# Patient Record
Sex: Female | Born: 1985 | Race: Black or African American | Hispanic: No | Marital: Married | Smoking: Former smoker
Health system: Southern US, Community
[De-identification: ages and names within clinical notes are randomized; demographics above are authoritative.]

## PROBLEM LIST (undated history)

## (undated) DIAGNOSIS — J020 Streptococcal pharyngitis: Secondary | ICD-10-CM

## (undated) DIAGNOSIS — L732 Hidradenitis suppurativa: Secondary | ICD-10-CM

## (undated) DIAGNOSIS — D649 Anemia, unspecified: Secondary | ICD-10-CM

## (undated) HISTORY — DX: Hidradenitis suppurativa: L73.2

---

## 2015-07-07 ENCOUNTER — Ambulatory Visit
Admission: EM | Admit: 2015-07-07 | Discharge: 2015-07-07 | Disposition: A | Discharge status: Other Acute Inpatient Hospital | Payer: Medicaid Other | Attending: Family Medicine | Admitting: Family Medicine

## 2015-07-07 ENCOUNTER — Emergency Department
Admission: EM | Admit: 2015-07-07 | Discharge: 2015-07-07 | Disposition: A | Discharge status: Home / Self Care | Payer: Medicaid Other | Attending: Emergency Medicine | Admitting: Emergency Medicine

## 2015-07-07 ENCOUNTER — Encounter: Payer: Self-pay | Admitting: Gynecology

## 2015-07-07 DIAGNOSIS — F17210 Nicotine dependence, cigarettes, uncomplicated: Secondary | ICD-10-CM | POA: Insufficient documentation

## 2015-07-07 DIAGNOSIS — L02412 Cutaneous abscess of left axilla: Secondary | ICD-10-CM | POA: Diagnosis present

## 2015-07-07 DIAGNOSIS — Z91040 Latex allergy status: Secondary | ICD-10-CM | POA: Diagnosis not present

## 2015-07-07 DIAGNOSIS — L02419 Cutaneous abscess of limb, unspecified: Secondary | ICD-10-CM

## 2015-07-07 DIAGNOSIS — Z3202 Encounter for pregnancy test, result negative: Secondary | ICD-10-CM | POA: Diagnosis not present

## 2015-07-07 DIAGNOSIS — L0291 Cutaneous abscess, unspecified: Secondary | ICD-10-CM

## 2015-07-07 LAB — GLUCOSE, CAPILLARY: Glucose-Capillary: 95 mg/dL (ref 65–99)

## 2015-07-07 MED ORDER — LIDOCAINE-EPINEPHRINE 1 %-1:200000 IJ SOLN
INTRAMUSCULAR | Status: AC
Start: 2015-07-07 — End: 2015-07-07
  Filled 2015-07-07: qty 30

## 2015-07-07 MED ORDER — SULFAMETHOXAZOLE-TRIMETHOPRIM 800-160 MG PO TABS
2.0000 | ORAL_TABLET | Freq: Once | ORAL | Status: AC
  Administered 2015-07-07: 2 via ORAL
  Filled 2015-07-07: qty 2

## 2015-07-07 MED ORDER — OXYCODONE-ACETAMINOPHEN 5-325 MG PO TABS
ORAL_TABLET | ORAL | Status: AC
  Filled 2015-07-07: qty 1

## 2015-07-07 MED ORDER — OXYCODONE-ACETAMINOPHEN 5-325 MG PO TABS
1.0000 | ORAL_TABLET | Freq: Once | ORAL | Status: AC
  Administered 2015-07-07: 1 via ORAL

## 2015-07-07 MED ORDER — SULFAMETHOXAZOLE-TRIMETHOPRIM 800-160 MG PO TABS: 2.0000 | ORAL_TABLET | Freq: Two times a day (BID) | ORAL | Status: DC

## 2015-07-07 NOTE — Discharge Instructions (Signed)
Needs to go to the ER for further pain control and I&D if needed.

## 2015-07-07 NOTE — ED Provider Notes (Signed)
[facility] Emergency Department Provider Note  ____________________________________________  Time seen: Approximately 4:32 PM  I have reviewed the triage vital signs and the nursing notes.   HISTORY  Chief Complaint Abscess    HPI Nichole Berry is a 29 y.o. female with a history of axillary abscesses who is presenting with a left axillary abscess. She says it has worsened over the past 2-3 days. She does not report a fever. She says that she has been having increased pain. Has had abscesses every 2-3 months. Does not see a primary care doctor. Does not know of any chronic medical conditions.   No past medical history on file.  There are no active problems to display for this patient.   Past Surgical History  Procedure Laterality Date  . Cesarean section      x2    No current outpatient prescriptions on file.  Allergies Latex  No family history on file.  Social History Social History  Substance Use Topics  . Smoking status: Current Every Day Smoker -- 0.50 packs/day    Types: Cigarettes  . Smokeless tobacco: Not on file  . Alcohol Use: No    Review of Systems Constitutional: No fever/chills Eyes: No visual changes. ENT: No sore throat. Cardiovascular: Denies chest pain. Respiratory: Denies shortness of breath. Gastrointestinal: No abdominal pain.  No nausea, no vomiting.  No diarrhea.  No constipation. Genitourinary: Negative for dysuria. Musculoskeletal: Negative for back pain. Skin: Negative for rash. Neurological: Negative for headaches, focal weakness or numbness.  10-point ROS otherwise negative.  ____________________________________________   PHYSICAL EXAM:  VITAL SIGNS: ED Triage Vitals  Enc Vitals Group     BP 07/07/15 1530 117/76 mmHg     Pulse Rate 07/07/15 1530 77     Resp 07/07/15 1530 18     Temp 07/07/15 1530 98.5 F (36.9 C)     Temp Source 07/07/15 1530 Oral     SpO2 07/07/15 1530 100 %     Weight  07/07/15 1530 310 lb (140.615 kg)     Height 07/07/15 1530 5\' 6"  (1.676 m)     Head Cir --      Peak Flow --      Pain Score 07/07/15 1531 10     Pain Loc --      Pain Edu? --      Excl. in GC? --     Constitutional: Alert and oriented. Well appearing and in no acute distress. Eyes: Conjunctivae are normal. PERRL. EOMI. Head: Atraumatic. Nose: No congestion/rhinnorhea. Mouth/Throat: Mucous membranes are moist.   Neck: No stridor.   Cardiovascular: Normal rate, regular rhythm. Grossly normal heart sounds.  Good peripheral circulation. Respiratory: Normal respiratory effort.  No retractions. Lungs CTAB. Gastrointestinal: Soft and nontender. No distention. No abdominal bruits. No CVA tenderness. Musculoskeletal: No lower extremity tenderness nor edema.  No joint effusions. Neurologic:  Normal speech and language. No gross focal neurologic deficits are appreciated. No gait instability. Skin:  Skin is warm, dry and intact. No rash noted. Left axilla with a 3 cm x 3 cm fluctuant area at the inferior border of the axilla. There is a indurated area anterior to this with hyperpigmentation. There is very tender without any pus drainage at this time. Psychiatric: Mood and affect are normal. Speech and behavior are normal.  ____________________________________________   LABS (all labs ordered are listed, but only abnormal results are displayed)  Labs Reviewed  GLUCOSE, CAPILLARY  CBG MONITORING, ED  POC URINE PREG, ED  ____________________________________________  EKG   ____________________________________________  RADIOLOGY   ____________________________________________   PROCEDURES  INCISION AND DRAINAGE Performed by: Arelia Longest Consent: Verbal consent obtained. Risks and benefits: risks, benefits and alternatives were discussed Type: abscess  Body area: Left axilla  Anesthesia: local infiltration  Incision was made with a scalpel.  Local anesthetic:  lidocaine 1 % with epinephrine  Anesthetic total: 3 ml  Complexity: complex Blunt dissection to break up loculations  Drainage: purulent  Drainage amount: 5 ML's   Packing material: 1/4 in iodoform gauze  Patient tolerance: Patient tolerated the procedure well with no immediate complications.     ____________________________________________   INITIAL IMPRESSION / ASSESSMENT AND PLAN / ED COURSE  Pertinent labs & imaging results that were available during my care of the patient were reviewed by me and considered in my medical decision making (see chart for details).  ----------------------------------------- 5:40 PM on 07/07/2015 -----------------------------------------  Patient is not a primary care doctor but will go to urgent care in 2 days for follow-up for her wound check as well as to remove the packing. She told me that she has had multiple abscesses in the past and also people in her family have abscesses. I suspect she has hidradenitis. We'll also give phone number for surgery follow-up. ____________________________________________   FINAL CLINICAL IMPRESSION(S) / ED DIAGNOSES  Left axillary abscess.    Myrna Blazer, MD 07/07/15 [phone]

## 2015-07-07 NOTE — ED Provider Notes (Addendum)
CSN: [id number]     Arrival date & time 07/07/15  1346 History   First MD Initiated Contact with Patient 07/07/15 1431     Chief Complaint  Patient presents with  . Recurrent Skin Infections   (Consider location/radiation/quality/duration/timing/severity/associated sxs/prior Treatment) HPI Patient presents today with history of left axillary abscess. Patient states that she often gets these in different places on her body. She states that she is in severe pain due to this abscess. She is tearful in the office due to this. She is currently not taking any antibiotics. She denies being diabetic. She has had subjective fever and some chills.  History reviewed. No pertinent past medical history. Past Surgical History  Procedure Laterality Date  . Cesarean section      x2   No family history on file. Social History  Substance Use Topics  . Smoking status: Current Every Day Smoker -- 0.50 packs/day    Types: Cigarettes  . Smokeless tobacco: None  . Alcohol Use: No   OB History    No data available     Review of Systems Negative except mentioned above.  Allergies  Latex  Home Medications   Prior to Admission medications   Not on File   Meds Ordered and Administered this Visit  Medications - No data to display  BP 102/65 mmHg  Pulse 75  Temp(Src) 98.3 F (36.8 C) (Oral)  Resp 20  Ht 5\' 6"  (1.676 m)  Wt 310 lb (140.615 kg)  BMI 50.06 kg/m2  SpO2 100%  LMP 06/24/2015 No data found.   Physical Exam  Patient is uncomfortable due to pain in left axillary area When raising patient's arm she becomes very tearful and is unable to let me examine it fully due to her pain.  ED Course  Procedures (including critical care time)  Labs Review Labs Reviewed - No data to display  Imaging Review No results found.     MDM   1. Abscess, axilla    Patient will need adequate pain control before any potential I&D of the area can be done. She is not cooperative with doing  the exam.  I have recommended that she go to the ER for further pain control before possible surgical procedure. Patient addresses understanding and there is someone with her that will take her to the ER now.    Jolene ProvostKirtida Shaira Sova, MD 07/07/15 1641  Jolene ProvostKirtida Wylan Gentzler, MD 07/07/15 [phone]

## 2015-07-07 NOTE — ED Notes (Signed)
Patient c/o recurrent boil at left axillary. Pt. Stated prone to boils under her axillary areas.

## 2015-07-07 NOTE — ED Notes (Signed)
Pt sent from [facility] urgent care with an abscess to her left underarm, was told that they don't have the medication they need to treat her there, pt states that it has been bothering her for the past 2-3 days, pt tearful

## 2015-07-10 LAB — POCT PREGNANCY, URINE: Preg Test, Ur: NEGATIVE

## 2016-01-28 ENCOUNTER — Emergency Department
Admission: EM | Admit: 2016-01-28 | Discharge: 2016-01-29 | Disposition: A | Discharge status: Home / Self Care | Payer: Medicaid Other | Attending: Emergency Medicine | Admitting: Emergency Medicine

## 2016-01-28 ENCOUNTER — Encounter: Payer: Self-pay | Admitting: Emergency Medicine

## 2016-01-28 DIAGNOSIS — F17210 Nicotine dependence, cigarettes, uncomplicated: Secondary | ICD-10-CM | POA: Diagnosis not present

## 2016-01-28 DIAGNOSIS — L732 Hidradenitis suppurativa: Secondary | ICD-10-CM | POA: Diagnosis not present

## 2016-01-28 DIAGNOSIS — Z792 Long term (current) use of antibiotics: Secondary | ICD-10-CM | POA: Insufficient documentation

## 2016-01-28 DIAGNOSIS — L02412 Cutaneous abscess of left axilla: Secondary | ICD-10-CM | POA: Diagnosis present

## 2016-01-28 MED ORDER — HYDROMORPHONE HCL 1 MG/ML IJ SOLN
1.0000 mg | Freq: Once | INTRAMUSCULAR | Status: AC
  Administered 2016-01-28: 1 mg via INTRAVENOUS
  Filled 2016-01-28: qty 1

## 2016-01-28 MED ORDER — CLINDAMYCIN PHOSPHATE 600 MG/50ML IV SOLN
600.0000 mg | Freq: Once | INTRAVENOUS | Status: AC
  Administered 2016-01-28: 600 mg via INTRAVENOUS
  Filled 2016-01-28: qty 50

## 2016-01-28 MED ORDER — ONDANSETRON HCL 4 MG/2ML IJ SOLN
4.0000 mg | Freq: Once | INTRAMUSCULAR | Status: AC
  Administered 2016-01-28: 4 mg via INTRAVENOUS
  Filled 2016-01-28: qty 2

## 2016-01-28 MED ORDER — OXYCODONE-ACETAMINOPHEN 7.5-325 MG PO TABS: 1.0000 | ORAL_TABLET | ORAL | Status: AC | PRN

## 2016-01-28 MED ORDER — CLINDAMYCIN HCL 150 MG PO CAPS: 150.0000 mg | ORAL_CAPSULE | Freq: Four times a day (QID) | ORAL | Status: DC

## 2016-01-28 NOTE — ED Notes (Signed)
Patient ambulatory to triage with steady gait, without difficulty or distress noted; pt reports abscess to left axillae x week with hx of same

## 2016-01-28 NOTE — ED Provider Notes (Signed)
[facility] Emergency Department Provider Note   ____________________________________________  Time seen: Approximately 10:19 PM  I have reviewed the triage vital signs and the nursing notes.   HISTORY  Chief Complaint Abscess    HPI Nichole Berry is a 30 y.o. female who presents to the emergency department with complaints of a left axilla abscess. She first noticed the pain and swelling one week ago. The pain and swelling has increased gradually. She also complains of left arm pain and tingling. Patient rates current pain as 10/10. She has tried warm compresses, tylenol, ibuprofen, and her mother's Tramadol with no relief. Denies fever, chills, nausea, vomiting, or recent antibiotic use.   History reviewed. No pertinent past medical history.  There are no active problems to display for this patient.   Past Surgical History  Procedure Laterality Date  . Cesarean section      x2    Current Outpatient Rx  Name  Route  Sig  Dispense  Refill  . clindamycin (CLEOCIN) 150 MG capsule   Oral   Take 1 capsule (150 mg total) by mouth 4 (four) times daily.   40 capsule   0   . oxyCODONE-acetaminophen (PERCOCET) 7.5-325 MG tablet   Oral   Take 1 tablet by mouth every 4 (four) hours as needed for severe pain.   20 tablet   0   . sulfamethoxazole-trimethoprim (BACTRIM DS,SEPTRA DS) 800-160 MG tablet   Oral   Take 2 tablets by mouth 2 (two) times daily.   40 tablet   0     Allergies Latex  No family history on file.  Social History Social History  Substance Use Topics  . Smoking status: Current Every Day Smoker -- 0.50 packs/day    Types: Cigarettes  . Smokeless tobacco: None  . Alcohol Use: No    Review of Systems Constitutional: No fever/chills Eyes: No visual changes. ENT: No sore throat. Cardiovascular: Denies chest pain. Respiratory: Denies shortness of breath. Gastrointestinal: No abdominal pain.  No nausea, no vomiting.  No  diarrhea.  No constipation. Genitourinary: Negative for dysuria. Musculoskeletal: Negative for back pain. Skin: Edema left axillary area  Neurological: Negative for headaches, focal weakness or numbness. Hematological/Lymphatic: Allergic/Immunilogical: Latex  ____________________________________________   PHYSICAL EXAM:  VITAL SIGNS: ED Triage Vitals  Enc Vitals Group     BP 01/28/16 2144 136/84 mmHg     Pulse Rate 01/28/16 2144 97     Resp 01/28/16 2144 20     Temp 01/28/16 2144 98.4 F (36.9 C)     Temp Source 01/28/16 2144 Oral     SpO2 01/28/16 2144 100 %     Weight 01/28/16 2144 310 lb (140.615 kg)     Height 01/28/16 2144  (1.676 m)     Head Cir --      Peak Flow --      Pain Score 01/28/16 2144 10     Pain Loc --      Pain Edu? --      Excl. in GC? --     Constitutional: Alert and oriented. Well appearing and in no acute distress. Morbid obesity Eyes: Conjunctivae are normal. PERRL. EOMI. Head: Atraumatic. Nose: No congestion/rhinnorhea. Mouth/Throat: Mucous membranes are moist.  Oropharynx non-erythematous. Neck: No stridor.  No cervical spine tenderness to palpation. Hematological/Lymphatic/Immunilogical: No cervical lymphadenopathy. Cardiovascular: Normal rate, regular rhythm. Grossly normal heart sounds.  Good peripheral circulation. Respiratory: Normal respiratory effort.  No retractions. Lungs CTAB. Gastrointestinal: Soft and nontender.  No distention. No abdominal bruits. No CVA tenderness. Musculoskeletal: No lower extremity tenderness nor edema.  No joint effusions. Neurologic:  Normal speech and language. No gross focal neurologic deficits are appreciated. No gait instability. Skin:  Skin is warm, dry and intact. Edematous nonfluctuant nodule lesion left axillary area Psychiatric: Mood and affect are normal. Speech and behavior are normal.  ____________________________________________   LABS (all labs ordered are listed, but only abnormal results  are displayed)  Labs Reviewed - No data to display ____________________________________________  EKG   ____________________________________________  RADIOLOGY   ____________________________________________   PROCEDURES  Procedure(s) performed: None  Critical Care performed: No  ____________________________________________   INITIAL IMPRESSION / ASSESSMENT AND PLAN / ED COURSE  Pertinent labs & imaging results that were available during my care of the patient were reviewed by me and considered in my medical decision making (see chart for details).  Hidradenitis left axillary area. Discussed rationale for not performing I&D at this time. Patient given discharge Instructions. Patient given prescription for clindamycin and Percocets. Patient in a work note for 3 days. Patient advised follow-up with the surgical clinic if there is no improvement in 3-5 days. ____________________________________________   FINAL CLINICAL IMPRESSION(S) / ED DIAGNOSES  Final diagnoses:  Hidradenitis axillaris      NEW MEDICATIONS STARTED DURING THIS VISIT:  New Prescriptions   CLINDAMYCIN (CLEOCIN) 150 MG CAPSULE    Take 1 capsule (150 mg total) by mouth 4 (four) times daily.   OXYCODONE-ACETAMINOPHEN (PERCOCET) 7.5-325 MG TABLET    Take 1 tablet by mouth every 4 (four) hours as needed for severe pain.     Note:  This document was prepared using Dragon voice recognition software and may include unintentional dictation errors.    Joni ReiningRonald K Smith, PA-C 01/28/16 2241  Sharman CheekPhillip Stafford, MD 01/28/16 2308

## 2016-01-28 NOTE — Discharge Instructions (Signed)

## 2016-01-31 ENCOUNTER — Telehealth: Payer: Self-pay | Admitting: Emergency Medicine

## 2016-01-31 NOTE — ED Notes (Signed)
Dr sankar's office called because pt was referred when he was not on unassigned call.  The patient has not called them yet.  I called the patient to inform her that she should follow with dr cooper instead.  Patient says she went to another hospital and has been taken care of.

## 2018-02-09 LAB — OB RESULTS CONSOLE HEPATITIS B SURFACE ANTIGEN: Hepatitis B Surface Ag: NEGATIVE

## 2018-02-09 LAB — OB RESULTS CONSOLE HIV ANTIBODY (ROUTINE TESTING): HIV: NONREACTIVE

## 2018-06-13 ENCOUNTER — Other Ambulatory Visit: Payer: Self-pay

## 2018-06-13 DIAGNOSIS — O365931 Maternal care for other known or suspected poor fetal growth, third trimester, fetus 1: Secondary | ICD-10-CM

## 2018-06-16 ENCOUNTER — Ambulatory Visit
Admission: RE | Admit: 2018-06-16 | Discharge: 2018-06-16 | Disposition: A | Discharge status: Home / Self Care | Payer: Medicaid Other | Source: Ambulatory Visit | Attending: Maternal & Fetal Medicine | Admitting: Maternal & Fetal Medicine

## 2018-06-16 DIAGNOSIS — O365931 Maternal care for other known or suspected poor fetal growth, third trimester, fetus 1: Secondary | ICD-10-CM

## 2018-06-16 DIAGNOSIS — O365930 Maternal care for other known or suspected poor fetal growth, third trimester, not applicable or unspecified: Secondary | ICD-10-CM | POA: Insufficient documentation

## 2018-06-16 DIAGNOSIS — Z3A36 36 weeks gestation of pregnancy: Secondary | ICD-10-CM | POA: Diagnosis not present

## 2018-06-16 NOTE — ED Notes (Signed)
Pt informed Dr.Small of the need for assistance with transportation to f/u appts twice a week with [facility].  Gayland Curry at the [facility] notified of need over the phone.  Pt was then assigned to Case Manager Rasheda at the [facility].  Arlana Hove is to evaluate the pt situation and contact the patient on her cell phone for her appt next week on both Monday and Thursday and continued until delivery at 39 weeks.

## 2018-06-19 LAB — OB RESULTS CONSOLE GBS: STREP GROUP B AG: NEGATIVE

## 2018-06-20 ENCOUNTER — Ambulatory Visit
Admission: RE | Admit: 2018-06-20 | Discharge: 2018-06-20 | Disposition: A | Discharge status: Home / Self Care | Payer: Medicaid Other | Source: Ambulatory Visit | Attending: Maternal & Fetal Medicine | Admitting: Maternal & Fetal Medicine

## 2018-06-20 ENCOUNTER — Other Ambulatory Visit: Payer: Self-pay

## 2018-06-20 DIAGNOSIS — O365931 Maternal care for other known or suspected poor fetal growth, third trimester, fetus 1: Secondary | ICD-10-CM

## 2018-06-20 NOTE — Progress Notes (Signed)
MFM NST review   Antenatal testing performed due to fetal growth restriction. 36/6 weeks.   NST 120s reactive, +mod variability No ctx  Findings reviewed.  She will continue twice weekly testing Plan delivery 39w (EFW 9th percentile).

## 2018-06-23 ENCOUNTER — Ambulatory Visit
Admission: RE | Admit: 2018-06-23 | Discharge: 2018-06-23 | Disposition: A | Discharge status: Home / Self Care | Payer: Medicaid Other | Source: Ambulatory Visit | Attending: Obstetrics and Gynecology | Admitting: Obstetrics and Gynecology

## 2018-06-23 ENCOUNTER — Encounter: Payer: Self-pay | Admitting: Obstetrics and Gynecology

## 2018-06-23 ENCOUNTER — Ambulatory Visit (INDEPENDENT_AMBULATORY_CARE_PROVIDER_SITE_OTHER): Payer: Medicaid Other | Admitting: Obstetrics and Gynecology

## 2018-06-23 VITALS — BP 112/66 | Wt 328.0 lb

## 2018-06-23 DIAGNOSIS — Z98891 History of uterine scar from previous surgery: Secondary | ICD-10-CM | POA: Insufficient documentation

## 2018-06-23 DIAGNOSIS — O99013 Anemia complicating pregnancy, third trimester: Secondary | ICD-10-CM

## 2018-06-23 DIAGNOSIS — O09299 Supervision of pregnancy with other poor reproductive or obstetric history, unspecified trimester: Secondary | ICD-10-CM | POA: Insufficient documentation

## 2018-06-23 DIAGNOSIS — O99213 Obesity complicating pregnancy, third trimester: Secondary | ICD-10-CM

## 2018-06-23 DIAGNOSIS — A5901 Trichomonal vulvovaginitis: Secondary | ICD-10-CM | POA: Insufficient documentation

## 2018-06-23 DIAGNOSIS — O99019 Anemia complicating pregnancy, unspecified trimester: Secondary | ICD-10-CM | POA: Insufficient documentation

## 2018-06-23 DIAGNOSIS — O99323 Drug use complicating pregnancy, third trimester: Secondary | ICD-10-CM | POA: Diagnosis not present

## 2018-06-23 DIAGNOSIS — Z3A37 37 weeks gestation of pregnancy: Secondary | ICD-10-CM | POA: Diagnosis not present

## 2018-06-23 DIAGNOSIS — O365930 Maternal care for other known or suspected poor fetal growth, third trimester, not applicable or unspecified: Secondary | ICD-10-CM

## 2018-06-23 DIAGNOSIS — O0993 Supervision of high risk pregnancy, unspecified, third trimester: Secondary | ICD-10-CM | POA: Insufficient documentation

## 2018-06-23 DIAGNOSIS — O34219 Maternal care for unspecified type scar from previous cesarean delivery: Secondary | ICD-10-CM | POA: Diagnosis not present

## 2018-06-23 DIAGNOSIS — O365931 Maternal care for other known or suspected poor fetal growth, third trimester, fetus 1: Secondary | ICD-10-CM | POA: Insufficient documentation

## 2018-06-23 DIAGNOSIS — O99210 Obesity complicating pregnancy, unspecified trimester: Secondary | ICD-10-CM | POA: Insufficient documentation

## 2018-06-23 DIAGNOSIS — O23593 Infection of other part of genital tract in pregnancy, third trimester: Secondary | ICD-10-CM

## 2018-06-23 DIAGNOSIS — O09293 Supervision of pregnancy with other poor reproductive or obstetric history, third trimester: Secondary | ICD-10-CM

## 2018-06-23 DIAGNOSIS — O23599 Infection of other part of genital tract in pregnancy, unspecified trimester: Secondary | ICD-10-CM

## 2018-06-23 DIAGNOSIS — O365990 Maternal care for other known or suspected poor fetal growth, unspecified trimester, not applicable or unspecified: Secondary | ICD-10-CM | POA: Insufficient documentation

## 2018-06-23 LAB — OB RESULTS CONSOLE RUBELLA ANTIBODY, IGM: Rubella: IMMUNE

## 2018-06-23 LAB — OB RESULTS CONSOLE VARICELLA ZOSTER ANTIBODY, IGG: Varicella: NON-IMMUNE/NOT IMMUNE

## 2018-06-23 LAB — OB RESULTS CONSOLE GC/CHLAMYDIA
Chlamydia: NEGATIVE
GC PROBE AMP, GENITAL: NEGATIVE

## 2018-06-23 NOTE — Progress Notes (Signed)
OB discuss delivery plan Previous C/S failure to progress

## 2018-06-23 NOTE — Progress Notes (Signed)
Obstetric H&P   Chief Complaint: Delivery planning  Prenatal Care Provider: [facility]  History of Present Illness: 32 y.o. [id number] [redacted]w[redacted]d by 07/12/2018, by Last Menstrual Period presenting for delivery planning in setting of two prior cesarean section and IUGR at 9%ile, with APT being done at [facility].  +FM, no LOF, no VB, no ctx.  Normal APT today at [facility]    She relays history of failing to dilate with G1 and repeat cesarean with G2.  She was put to sleep for her C-section with G2 pregnancy after 6 failed attempts at spinal anesthesia.     Pregravid weight Pregravid weight not on file Total Weight Gain Not found.  Pregnancy Problems (from 06/16/18 to present)    Problem Noted Resolved   Pregnancy, supervision, high-risk, third trimester 06/23/2018 by Vena Austria, MD No   Overview Addendum 06/23/2018  2:52 PM by Vena Austria, MD    Clinic [place] transfer to [facility] Prenatal Labs  Dating LMP = 26 week [place] Blood type: O positive  Genetic Screen NIPS: Negative Antibody: Negative  Anatomic [place] Normal Female Rubella: Immune Varicella: Non-Immune  GTT Early: 96 Third trimester: 73 RPR: NR  Rhogam N/A HBsAg: Negative  TDaP vaccine 05/27/18 Flu Shot: 05/27/18 HIV: Negative  Baby Food Breast                         GBS:   Contraception None Pap: No records on pap from [facility] or [place]  CBB   Hgb electrophoresis - alpha thalassemia   CS/VBAC Repeat C-section 07/05/18   Support Person Calvin (Husband)           IUGR (intrauterine growth restriction) affecting care of mother 06/23/2018 by Vena Austria, MD No   Overview Signed 06/23/2018  2:47 PM by Vena Austria, MD    Being followed by DP with twice weekly APT      History of cesarean section 06/23/2018 by Vena Austria, MD No   Maternal obesity, antepartum 06/23/2018 by Vena Austria, MD No   Overview Signed 06/23/2018  2:48 PM by Vena Austria, MD    BMI Body mass index is 52.94 kg/m. - anesthesia consult 06/27/2018  at 12:00      Substance abuse affecting pregnancy in third trimester, antepartum 06/23/2018 by Vena Austria, MD No   Overview Signed 06/23/2018  2:49 PM by Vena Austria, MD    THC + UDS at [facility], also history of EtOH abuse      Trichomonal vaginitis during pregnancy, antepartum 06/23/2018 by Vena Austria, MD No   Anemia, antepartum 06/23/2018 by Vena Austria, MD No   Overview Signed 06/23/2018  2:50 PM by Vena Austria, MD    Hgb 9.8 at 28 weeks, one supplemental iron      History of postpartum hemorrhage, currently pregnant 06/23/2018 by Vena Austria, MD No      Review of Systems: 10 point review of systems negative unless otherwise noted in HPI  Past Medical History: History reviewed. No pertinent past medical history.  Past Surgical History: Past Surgical History:  Procedure Laterality Date  . CESAREAN SECTION  2006, 2013   x2    Past Obstetric History: #: 1, Date: 10/18/04, Sex: Female, Weight: None, GA: None, Delivery: C-Section, Low Transverse, Apgar1: None, Apgar5: None, Living: Living, Birth Comments: None  #: 2, Date: 05/03/12, Sex: Female, Weight: None, GA: None, Delivery: C-Section, Low Transverse, Apgar1: None, Apgar5: None, Living: Living, Birth Comments: None  #: 3, Date: None, Sex:  None, Weight: None, GA: None, Delivery: None, Apgar1: None, Apgar5: None, Living: None, Birth Comments: None   Past Gynecologic History:  Family History: Family History  Problem Relation Age of Onset  . Hypertension Mother   . Diabetes Mother   . Kidney disease Father   . Cancer Maternal Grandmother   . Diabetes Maternal Grandmother   . Hypertension Maternal Grandmother   . Cancer Maternal Grandfather   . Diabetes Maternal Grandfather   . Hypertension Maternal Grandfather     Social History: Social History   Socioeconomic History  . Marital status: Married    Spouse name: Jerilynn Som  . Number of children: Not on file  . Years of education: Not on  file  . Highest education level: Not on file  Occupational History  . Occupation: Unemployed  Social Needs  . Financial resource strain: Not on file  . Food insecurity:    Worry: Not on file    Inability: Not on file  . Transportation needs:    Medical: Not on file    Non-medical: Not on file  Tobacco Use  . Smoking status: Former Smoker    Packs/day: 0.50    Types: Cigarettes  . Smokeless tobacco: Never Used  Substance and Sexual Activity  . Alcohol use: No  . Drug use: No  . Sexual activity: Yes  Lifestyle  . Physical activity:    Days per week: Not on file    Minutes per session: Not on file  . Stress: Not on file  Relationships  . Social connections:    Talks on phone: Not on file    Gets together: Not on file    Attends religious service: Not on file    Active member of club or organization: Not on file    Attends meetings of clubs or organizations: Not on file    Relationship status: Not on file  . Intimate partner violence:    Fear of current or ex partner: Not on file    Emotionally abused: Not on file    Physically abused: Not on file    Forced sexual activity: Not on file  Other Topics Concern  . Not on file  Social History Narrative  . Not on file    Medications: Prior to Admission medications   Medication Sig Start Date End Date Taking? Authorizing Provider  ferrous sulfate 325 (65 FE) MG tablet Take 325 mg by mouth 2 (two) times daily with a meal.   Yes [provider]  Prenatal Vit-Fe Fumarate-FA (MULTIVITAMIN-PRENATAL) 27-0.8 MG TABS tablet Take 1 tablet by mouth daily at 12 noon.   Yes [provider]    Allergies: Allergies  Allergen Reactions  . Latex Hives    Physical Exam: Vitals: Blood pressure 112/66, weight (!) 328 lb (148.8 kg), last menstrual period 10/05/2017.  General: NAD HEENT: normocephalic, anicteric Pulmonary: No increased work of breathing Cardiovascular: RRR, distal pulses 2+ Abdomen: Gravid,  non-tender, well healed pfannenstiel  Genitourinary: deferred Extremities: no edema, erythema, or tenderness Neurologic: Grossly intact Psychiatric: mood appropriate, affect full  Labs: No results found for this or any previous visit (from the past 24 hour(s)).  Assessment: 32 y.o. [id number] [redacted]w[redacted]d by 07/12/2018, by Last Menstrual Period  Presenting for delivery planning  Plan: 1) History of 2 prior cesarean section - delivery recommended at 39 week by [facility].  Given BMI over 50 anesthesia consult.    2) History of general anesthesia with G2 pregnancy secondary to failed attempts at spinal  3) PNL - Blood type  O positive / Anti-bodyscreen Negative / Rubella Immune / Varicella Non-immune / RPR   / HBsAg negative / HIV negative / 1-hr OGTT 73 4) Immunization History -   There is no immunization history on file for this patient.  5) Disposition - C-section scheduled for 07/05/18 pending evaluation by anesthesia.  Discussed if difficult airway she may need to delivery outside of [facility]  Vena Austria, MD, [facility], [facility] 06/23/2018, 2:11 PM

## 2018-06-27 ENCOUNTER — Other Ambulatory Visit: Payer: Self-pay

## 2018-06-27 ENCOUNTER — Encounter
Admission: RE | Admit: 2018-06-27 | Discharge: 2018-06-27 | Disposition: A | Discharge status: Home / Self Care | Payer: Medicaid Other | Source: Ambulatory Visit | Attending: Anesthesiology | Admitting: Anesthesiology

## 2018-06-27 ENCOUNTER — Ambulatory Visit
Admission: RE | Admit: 2018-06-27 | Discharge: 2018-06-27 | Disposition: A | Discharge status: Home / Self Care | Payer: Medicaid Other | Source: Ambulatory Visit | Attending: Maternal & Fetal Medicine | Admitting: Maternal & Fetal Medicine

## 2018-06-27 DIAGNOSIS — O0993 Supervision of high risk pregnancy, unspecified, third trimester: Secondary | ICD-10-CM

## 2018-06-27 DIAGNOSIS — O365930 Maternal care for other known or suspected poor fetal growth, third trimester, not applicable or unspecified: Secondary | ICD-10-CM

## 2018-06-27 DIAGNOSIS — O99019 Anemia complicating pregnancy, unspecified trimester: Secondary | ICD-10-CM

## 2018-06-27 DIAGNOSIS — A5901 Trichomonal vulvovaginitis: Secondary | ICD-10-CM

## 2018-06-27 DIAGNOSIS — Z98891 History of uterine scar from previous surgery: Secondary | ICD-10-CM

## 2018-06-27 DIAGNOSIS — O23599 Infection of other part of genital tract in pregnancy, unspecified trimester: Secondary | ICD-10-CM

## 2018-06-27 DIAGNOSIS — O09299 Supervision of pregnancy with other poor reproductive or obstetric history, unspecified trimester: Secondary | ICD-10-CM

## 2018-06-27 DIAGNOSIS — O99210 Obesity complicating pregnancy, unspecified trimester: Secondary | ICD-10-CM | POA: Diagnosis not present

## 2018-06-27 DIAGNOSIS — O99323 Drug use complicating pregnancy, third trimester: Secondary | ICD-10-CM

## 2018-06-27 NOTE — Consult Note (Signed)
[facility] Anesthesia Consultation  Nichole Berry ZOX:[id number] DOB: 10/03/1985 DOA: 06/27/2018 PCP: Department, [facility]   Requesting physician: Dr. Bonney Aid Date of consultation: 06/27/18 Reason for consultation: Obesity during pregnancy  CHIEF COMPLAINT:  Obesity during pregnancy  HISTORY OF PRESENT ILLNESS: Nichole Berry  is a 32 y.o. female with a known history of obesity in pregnancy, 2 prior cesarean deliveries, planning for repeat cesarean delivery. Pt reports that for her second cesarean they attempted epidural anesthesia but was only numb on one side and ended up having general anesthesia. Also had epidural anesthesia for her first cesarean with no problems. Denies hx of cardiac disease. Denies hx of asthma. Denies personal or family hx of bleeding disorders.   PAST MEDICAL HISTORY:  No past medical history on file.  PAST SURGICAL HISTORY:  Past Surgical History:  Procedure Laterality Date  . CESAREAN SECTION  2006, 2013   x2    SOCIAL HISTORY:  Social History   Tobacco Use  . Smoking status: Former Smoker    Packs/day: 0.50    Types: Cigarettes  . Smokeless tobacco: Never Used  Substance Use Topics  . Alcohol use: No    FAMILY HISTORY:  Family History  Problem Relation Age of Onset  . Hypertension Mother   . Diabetes Mother   . Kidney disease Father   . Cancer Maternal Grandmother   . Diabetes Maternal Grandmother   . Hypertension Maternal Grandmother   . Cancer Maternal Grandfather   . Diabetes Maternal Grandfather   . Hypertension Maternal Grandfather     DRUG ALLERGIES:  Allergies  Allergen Reactions  . Latex Hives    REVIEW OF SYSTEMS:   RESPIRATORY: No cough, shortness of breath, wheezing.  CARDIOVASCULAR: No chest pain, orthopnea, edema.  HEMATOLOGY: No anemia, easy bruising or bleeding SKIN: No rash or lesion. NEUROLOGIC: No tingling, numbness, weakness.  PSYCHIATRY: No anxiety or  depression.   MEDICATIONS AT HOME:  Prior to Admission medications   Medication Sig Start Date End Date Taking? Authorizing Provider  ferrous sulfate 325 (65 FE) MG tablet Take 325 mg by mouth 2 (two) times daily with a meal.    [provider]  Prenatal Vit-Fe Fumarate-FA (MULTIVITAMIN-PRENATAL) 27-0.8 MG TABS tablet Take 1 tablet by mouth daily at 12 noon.    [provider]      PHYSICAL EXAMINATION:   VITAL SIGNS: Blood pressure 110/78, pulse 84, temperature 36.9 C, resp. rate 18, weight (!) 147.7 kg, last menstrual period 10/05/2017, SpO2 100 %.  GENERAL:  32 y.o.-year-old patient no acute distress.  HEENT: Head atraumatic, normocephalic. Oropharynx and nasopharynx clear. MP 2, TM distance >3 cm, normal mouth opening. LUNGS: Normal breath sounds bilaterally, no wheezing, rales,rhonchi. No use of accessory muscles of respiration.  CARDIOVASCULAR: S1, S2 normal. No murmurs, rubs, or gallops.  EXTREMITIES: No pedal edema, cyanosis, or clubbing.  NEUROLOGIC: normal gait PSYCHIATRIC: The patient is alert and oriented x 3.  SKIN: No obvious rash, lesion, or ulcer.    IMPRESSION AND PLAN:   Nichole Berry  is a 32 y.o. female presenting with obesity during pregnancy. BMI is currently 52 at [redacted] weeks gestation.   Plan for repeat cesarean delivery at [facility], pt is scheduled for 07/05/18. Discussed plan of spinal anesthesia for cesarean.   Anesthesia record from her second delivery at [facility] was reviewed, it appears that pt had an epidural prior to her c-section, possible intrathecal catheter based on notes, then dosed for cesarean but block was insufficient  on one side and pt was put under GA for the procedure. No indication of difficult airway in anesthetic record and pt's BMI was similar to current BMI. She did require a EBP to treat PDPH following that delivery.

## 2018-06-28 ENCOUNTER — Telehealth: Payer: Self-pay | Admitting: Obstetrics & Gynecology

## 2018-06-28 NOTE — Telephone Encounter (Signed)
Patient is aware of [facility] on Monday, 07/04/18 @ 11:40am w/ Dr Tiburcio PeaHarris, Pre-admit Testing afterwards, and OR on 07/05/18. Ext given.

## 2018-06-28 NOTE — Telephone Encounter (Signed)
-----   Message from Vena AustriaAndreas Staebler, MD sent at 06/23/2018  2:31 PM EST ----- Regarding: C-section Surgery Date:   LOS: surgery admit  Surgery Booking Request Patient Full Name: Nichole Berry MRN: [id number]  DOB: [birth year]  Surgeon: Velora Mediateobert Harris, MD  Requested Surgery Date and Time: 07/05/2018 Primary Diagnosis and Code: History C-section Secondary Diagnosis and Code:  Surgical Procedure: Cesarean Section L&D Notification:yes Admission Status: inpatient Length of Surgery: 1-hr Special Case Needs: none H&P:day prior (date) Phone Interview or Office Pre-Admit: pre-admit Interpreter: No Language: English Medical Clearance: No Special Scheduling Instructions: BMI 52, anesthesia consult 06/27/18 pending

## 2018-06-30 ENCOUNTER — Ambulatory Visit
Admission: RE | Admit: 2018-06-30 | Discharge: 2018-06-30 | Disposition: A | Discharge status: Home / Self Care | Payer: Medicaid Other | Source: Ambulatory Visit | Attending: Obstetrics and Gynecology | Admitting: Obstetrics and Gynecology

## 2018-06-30 DIAGNOSIS — N390 Urinary tract infection, site not specified: Secondary | ICD-10-CM | POA: Insufficient documentation

## 2018-06-30 DIAGNOSIS — Z3A38 38 weeks gestation of pregnancy: Secondary | ICD-10-CM | POA: Diagnosis not present

## 2018-06-30 DIAGNOSIS — O365930 Maternal care for other known or suspected poor fetal growth, third trimester, not applicable or unspecified: Secondary | ICD-10-CM | POA: Insufficient documentation

## 2018-06-30 DIAGNOSIS — Z98891 History of uterine scar from previous surgery: Secondary | ICD-10-CM

## 2018-06-30 DIAGNOSIS — N3000 Acute cystitis without hematuria: Secondary | ICD-10-CM

## 2018-06-30 NOTE — Progress Notes (Signed)
NST review  Reviewed as reactive on 06/27/18 but not recorded  baseline130 Moderate variability Pos accels No decels Irregular UCs   Start 10:21  End 10:46  Jimmey RalphLivingston, Nichole Swartzentruber, MD

## 2018-06-30 NOTE — Consult Note (Signed)
NST  Baseline 140  Moderate variability  Pos accels  Neg decels  No UCS REactive NST  Start :13:22 End 14:08  AFI and dopplers done  ceph AFI =12 s/d =2 wnl  Jimmey RalphLivingston, Nori Poland

## 2018-07-04 ENCOUNTER — Ambulatory Visit (HOSPITAL_BASED_OUTPATIENT_CLINIC_OR_DEPARTMENT_OTHER)
Admission: RE | Admit: 2018-07-04 | Discharge: 2018-07-04 | Disposition: A | Discharge status: Home / Self Care | Payer: Medicaid Other | Source: Ambulatory Visit | Attending: Obstetrics & Gynecology | Admitting: Obstetrics & Gynecology

## 2018-07-04 ENCOUNTER — Encounter
Admission: RE | Admit: 2018-07-04 | Discharge: 2018-07-04 | Disposition: A | Discharge status: Home / Self Care | Payer: Medicaid Other | Source: Ambulatory Visit | Attending: Obstetrics & Gynecology | Admitting: Obstetrics & Gynecology

## 2018-07-04 ENCOUNTER — Other Ambulatory Visit: Payer: Self-pay

## 2018-07-04 ENCOUNTER — Ambulatory Visit (INDEPENDENT_AMBULATORY_CARE_PROVIDER_SITE_OTHER): Payer: Medicaid Other | Admitting: Obstetrics and Gynecology

## 2018-07-04 ENCOUNTER — Encounter: Payer: Self-pay | Admitting: Obstetrics and Gynecology

## 2018-07-04 ENCOUNTER — Encounter: Payer: Medicaid Other | Admitting: Obstetrics & Gynecology

## 2018-07-04 VITALS — BP 128/76 | Ht 66.0 in | Wt 315.0 lb

## 2018-07-04 DIAGNOSIS — O99019 Anemia complicating pregnancy, unspecified trimester: Secondary | ICD-10-CM

## 2018-07-04 DIAGNOSIS — O0993 Supervision of high risk pregnancy, unspecified, third trimester: Secondary | ICD-10-CM | POA: Diagnosis not present

## 2018-07-04 DIAGNOSIS — O99213 Obesity complicating pregnancy, third trimester: Secondary | ICD-10-CM

## 2018-07-04 DIAGNOSIS — O99323 Drug use complicating pregnancy, third trimester: Secondary | ICD-10-CM

## 2018-07-04 DIAGNOSIS — O23599 Infection of other part of genital tract in pregnancy, unspecified trimester: Secondary | ICD-10-CM

## 2018-07-04 DIAGNOSIS — O99210 Obesity complicating pregnancy, unspecified trimester: Secondary | ICD-10-CM

## 2018-07-04 DIAGNOSIS — O365930 Maternal care for other known or suspected poor fetal growth, third trimester, not applicable or unspecified: Secondary | ICD-10-CM

## 2018-07-04 DIAGNOSIS — Z01818 Encounter for other preprocedural examination: Secondary | ICD-10-CM

## 2018-07-04 DIAGNOSIS — O23593 Infection of other part of genital tract in pregnancy, third trimester: Secondary | ICD-10-CM | POA: Diagnosis not present

## 2018-07-04 DIAGNOSIS — O09299 Supervision of pregnancy with other poor reproductive or obstetric history, unspecified trimester: Secondary | ICD-10-CM

## 2018-07-04 DIAGNOSIS — A5901 Trichomonal vulvovaginitis: Secondary | ICD-10-CM

## 2018-07-04 DIAGNOSIS — O99013 Anemia complicating pregnancy, third trimester: Secondary | ICD-10-CM | POA: Diagnosis not present

## 2018-07-04 DIAGNOSIS — O09293 Supervision of pregnancy with other poor reproductive or obstetric history, third trimester: Secondary | ICD-10-CM | POA: Diagnosis not present

## 2018-07-04 DIAGNOSIS — O34219 Maternal care for unspecified type scar from previous cesarean delivery: Secondary | ICD-10-CM

## 2018-07-04 DIAGNOSIS — Z3A38 38 weeks gestation of pregnancy: Secondary | ICD-10-CM

## 2018-07-04 DIAGNOSIS — Z98891 History of uterine scar from previous surgery: Secondary | ICD-10-CM

## 2018-07-04 HISTORY — DX: Anemia, unspecified: D64.9

## 2018-07-04 LAB — CBC
HCT: 33.6 % — ABNORMAL LOW (ref 36.0–46.0)
Hemoglobin: 10.8 g/dL — ABNORMAL LOW (ref 12.0–15.0)
MCH: 24.1 pg — ABNORMAL LOW (ref 26.0–34.0)
MCHC: 32.1 g/dL (ref 30.0–36.0)
MCV: 75 fL — ABNORMAL LOW (ref 80.0–100.0)
NRBC: 0 % (ref 0.0–0.2)
PLATELETS: 295 10*3/uL (ref 150–400)
RBC: 4.48 MIL/uL (ref 3.87–5.11)
RDW: 15.4 % (ref 11.5–15.5)
WBC: 9.6 10*3/uL (ref 4.0–10.5)

## 2018-07-04 LAB — RAPID HIV SCREEN (HIV 1/2 AB+AG)
HIV 1/2 Antibodies: NONREACTIVE
HIV-1 P24 Antigen - HIV24: NONREACTIVE

## 2018-07-04 LAB — TYPE AND SCREEN
ABO/RH(D): O POS
ANTIBODY SCREEN: NEGATIVE
Extend sample reason: UNDETERMINED

## 2018-07-04 MED ORDER — DEXTROSE 5 % IV SOLN
3.0000 g | INTRAVENOUS | Status: AC
  Administered 2018-07-05: 3 g via INTRAVENOUS
  Filled 2018-07-04: qty 3

## 2018-07-04 NOTE — Progress Notes (Signed)
[facility] - NST Visit  Baseline 130 Variability Moderate Reactive No decels Toco: no contractions  Reactive NST Has delivery scheduled for tomorrow  Vollie Brunty, [place] A, MD

## 2018-07-04 NOTE — Patient Instructions (Signed)
Your procedure is scheduled on: Tuesday, July 05, 2018 Report to the Birth Place on the 3rd floor at 5:30 am. Go through the emergency room entrance and [facility]   REMEMBER: Instructions that are not followed completely may result in serious medical risk, up to and including death; or upon the discretion of your surgeon and anesthesiologist your surgery may need to be rescheduled.  Do not eat food after midnight the night before surgery.  No gum chewing, lozengers or hard candies.  You may however, drink CLEAR liquids up to 2 hours before you are scheduled to arrive for your surgery. Do not drink anything within 2 hours of the start of your surgery.  Clear liquids include: - water  - apple juice without pulp - gatorade - black coffee or tea (Do NOT add milk or creamers to the coffee or tea) Do NOT drink anything that is not on this list.  No Alcohol for 24 hours before or after surgery.  No Smoking including e-cigarettes for 24 hours prior to surgery.  No chewable tobacco products for at least 6 hours prior to surgery.  No nicotine patches on the day of surgery.  On the morning of surgery brush your teeth with toothpaste and water, you may rinse your mouth with mouthwash if you wish. Do not swallow any toothpaste or mouthwash.  Notify your doctor if there is any change in your medical condition (cold, fever, infection).  Do not wear jewelry, make-up, hairpins, clips or nail polish.  Do not wear lotions, powders, or perfumes.   Do not shave 48 hours prior to surgery.   Contacts and dentures may not be worn into surgery.  Do not bring valuables to the hospital, including drivers license, insurance or credit cards.  [facility] is not responsible for any belongings or valuables.   TAKE THESE MEDICATIONS THE MORNING OF SURGERY:  none  Use CHG wipes as directed on instruction sheet.  Now!  Stop Anti-inflammatories (NSAIDS) such as Advil,  Aleve, Ibuprofen, Motrin, Naproxen, Naprosyn and Aspirin based products such as Excedrin, Goodys Powder, BC Powder. (May take Tylenol or Acetaminophen if needed.)  Now!  Stop ANY OVER THE COUNTER supplements until after surgery. (May continue iron and multivitamin.)  If you are taking public transportation, you will need to have a responsible adult with you. Please confirm with your physician that it is acceptable to use public transportation.   Please call [phone] if you have any questions about these instructions.

## 2018-07-04 NOTE — H&P (View-Only) (Signed)
OB History & Physical   History of Present Illness:  Chief Complaint: Cesarean section preoperative visit  HPI:  Nichole LedererMartina Radke is a 32 y.o. 313P2002 female at 4546w6d dated by LMP consistent with 20 week ultrasound.  Her pregnancy has been complicated by Fetal growth restriction, depression, EtOH abuse, trichomonas, late entry to care, POSTPARTUM HEMORRHAGE, marijuana use, anemia, alpha thalasemmia trait, morbid obesity (BMI >50).  Cleared by Dr. Priscella MannPenwarden.  G2 was under general anesthesia (postpartum required blood patch due to post-dural puncture headache).   She reports mild and occasional contractions.   She denies leakage of fluid.   She denies vaginal bleeding.   She reports fetal movement.    Maternal Medical History:  No past medical history on file.  Past Surgical History:  Procedure Laterality Date  . CESAREAN SECTION  2006, 2013   x2    Allergies  Allergen Reactions  . Latex Hives    Prior to Admission medications   Medication Sig Start Date End Date Taking? Authorizing Provider  ferrous sulfate 325 (65 FE) MG tablet Take 325 mg by mouth 2 (two) times daily with a meal.    [provider]  Prenatal Vit-Fe Fumarate-FA (MULTIVITAMIN-PRENATAL) 27-0.8 MG TABS tablet Take 1 tablet by mouth daily.     [provider]    OB History  Gravida Para Term Preterm AB Living  3 2 2    0 2  SAB TAB Ectopic Multiple Live Births          2    # Outcome Date GA Lbr Len/2nd Weight Sex Delivery Anes PTL Lv  3 Current           2 Term 05/03/12    Lonia SkinnerM CS-LTranv  N LIV  1 Term 10/18/04    Theresa DutyF CS-LTranv  N LIV    [place] care site[place] [facility], [facility]. Also, followed by [facility].   Social History: She  reports that she has quit smoking. Her smoking use included cigarettes. She smoked 0.50 packs per day. She has never used smokeless tobacco. She reports that she does not drink alcohol or use  drugs.  Family History: family history includes Cancer in her maternal grandfather and maternal grandmother; Diabetes in her maternal grandfather, maternal grandmother, and mother; Hypertension in her maternal grandfather, maternal grandmother, and mother; Kidney disease in her father.   Review of Systems: Negative x 10 systems reviewed except as noted in the HPI.    Physical Exam:  Vital Signs: BP 128/76   Ht 5\' 6"  (1.676 m)   Wt (!) 315 lb (142.9 kg)   LMP 10/05/2017   BMI 50.84 kg/m  Constitutional: Well nourished, well developed female in no acute distress.  HEENT: normal Skin: Warm and dry.  Cardiovascular: Regular rate and rhythm.   Extremity: no edema  Respiratory: Clear to auscultation bilateral. Normal respiratory effort Abdomen: FHT present and gravid/NT Back: no CVAT Neuro: DTRs 2+, Cranial nerves grossly intact Psych: Alert and Oriented x3. No memory deficits. Normal mood and affect.  MS: normal gait, normal bilateral lower extremity ROM/strength/stability.   Pertinent Results:  Prenatal Labs: Blood type/Rh O positive  Antibody screen negative  Rubella Immune  Varicella Not immune    RPR NR  HBsAg negative  HIV negative  GC unknown  Chlamydia unknown  Genetic screening NIPT negative, msAFP negative   1 hour GTT Early 96, 3rd trimester 73  3 hour GTT n/a  GBS negative on 06/19/18   Baseline FHR: 115 beats/min      Assessment:  Nichole Berry is a 32 y.o. G59P2002 female at [redacted]w[redacted]d with history of cesarean delivery, desires repeat.   Plan:  1. Admit to Labor & Delivery  2. CBC, T&S, NPO, IVF 3. GBS negative.   4. Fetwal well-being: reassuring 5. To OR tomorrow, as previously schedule with Dr. Annye Asa, MD 07/04/2018 11:39 AM

## 2018-07-04 NOTE — Progress Notes (Signed)
OB History & Physical   History of Present Illness:  Chief Complaint: Cesarean section preoperative visit  HPI:  Nichole Berry is a 32 y.o. G3P2002 female at [redacted]w[redacted]d dated by LMP consistent with 20 week ultrasound.  Her pregnancy has been complicated by Fetal growth restriction, depression, EtOH abuse, trichomonas, late entry to care, POSTPARTUM HEMORRHAGE, marijuana use, anemia, alpha thalasemmia trait, morbid obesity (BMI >50).  Cleared by Dr. Penwarden.  G2 was under general anesthesia (postpartum required blood patch due to post-dural puncture headache).   She reports mild and occasional contractions.   She denies leakage of fluid.   She denies vaginal bleeding.   She reports fetal movement.    Maternal Medical History:  No past medical history on file.  Past Surgical History:  Procedure Laterality Date  . CESAREAN SECTION  2006, 2013   x2    Allergies  Allergen Reactions  . Latex Hives    Prior to Admission medications   Medication Sig Start Date End Date Taking? Authorizing Provider  ferrous sulfate 325 (65 FE) MG tablet Take 325 mg by mouth 2 (two) times daily with a meal.    [provider]  Prenatal Vit-Fe Fumarate-FA (MULTIVITAMIN-PRENATAL) 27-0.8 MG TABS tablet Take 1 tablet by mouth daily.     [provider]    OB History  Gravida Para Term Preterm AB Living  3 2 2   0 2  SAB TAB Ectopic Multiple Live Births          2    # Outcome Date GA Lbr Len/2nd Weight Sex Delivery Anes PTL Lv  3 Current           2 Term 05/03/12    M CS-LTranv  N LIV  1 Term 10/18/04    F CS-LTranv  N LIV    Prenatal care site: [facility]/transfer from [place] where she initiated care, a few visits to [facility] during pregnancy. Also, followed by [facility].   Social History: She  reports that she has quit smoking. Her smoking use included cigarettes. She smoked 0.50 packs per day. She has never used smokeless tobacco. She reports that she does not drink alcohol or use  drugs.  Family History: family history includes Cancer in her maternal grandfather and maternal grandmother; Diabetes in her maternal grandfather, maternal grandmother, and mother; Hypertension in her maternal grandfather, maternal grandmother, and mother; Kidney disease in her father.   Review of Systems: Negative x 10 systems reviewed except as noted in the HPI.    Physical Exam:  Vital Signs: BP 128/76   Ht 5' 6" (1.676 m)   Wt (!) 315 lb (142.9 kg)   LMP 10/05/2017   BMI 50.84 kg/m  Constitutional: Well nourished, well developed female in no acute distress.  HEENT: normal Skin: Warm and dry.  Cardiovascular: Regular rate and rhythm.   Extremity: no edema  Respiratory: Clear to auscultation bilateral. Normal respiratory effort Abdomen: FHT present and gravid/NT Back: no CVAT Neuro: DTRs 2+, Cranial nerves grossly intact Psych: Alert and Oriented x3. No memory deficits. Normal mood and affect.  MS: normal gait, normal bilateral lower extremity ROM/strength/stability.   Pertinent Results:  Prenatal Labs: Blood type/Rh O positive  Antibody screen negative  Rubella Immune  Varicella Not immune    RPR NR  HBsAg negative  HIV negative  GC unknown  Chlamydia unknown  Genetic screening NIPT negative, msAFP negative   1 hour GTT Early 96, 3rd trimester 73  3 hour GTT n/a    GBS negative on 06/19/18   Baseline FHR: 115 beats/min      Assessment:  Nichole Berry is a 32 y.o. G3P2002 female at [redacted]w[redacted]d with history of cesarean delivery, desires repeat.   Plan:  1. Admit to Labor & Delivery  2. CBC, T&S, NPO, IVF 3. GBS negative.   4. Fetwal well-being: reassuring 5. To OR tomorrow, as previously schedule with Dr. Harris   Nichole Hevia, MD 07/04/2018 11:39 AM    

## 2018-07-05 ENCOUNTER — Encounter
Admission: RE | Disposition: A | Discharge status: Home / Self Care | Payer: Self-pay | Source: Home / Self Care | Attending: Obstetrics & Gynecology

## 2018-07-05 ENCOUNTER — Inpatient Hospital Stay: Payer: Medicaid Other | Admitting: Anesthesiology

## 2018-07-05 ENCOUNTER — Inpatient Hospital Stay
Admission: RE | Admit: 2018-07-05 | Discharge: 2018-07-07 | DRG: 787 | Disposition: A | Discharge status: Home / Self Care | Payer: Medicaid Other | Attending: Obstetrics & Gynecology | Admitting: Obstetrics & Gynecology

## 2018-07-05 DIAGNOSIS — Z3A38 38 weeks gestation of pregnancy: Secondary | ICD-10-CM

## 2018-07-05 DIAGNOSIS — Z87891 Personal history of nicotine dependence: Secondary | ICD-10-CM

## 2018-07-05 DIAGNOSIS — Z98891 History of uterine scar from previous surgery: Secondary | ICD-10-CM

## 2018-07-05 DIAGNOSIS — Z3A39 39 weeks gestation of pregnancy: Secondary | ICD-10-CM | POA: Diagnosis not present

## 2018-07-05 DIAGNOSIS — O9902 Anemia complicating childbirth: Secondary | ICD-10-CM | POA: Diagnosis present

## 2018-07-05 DIAGNOSIS — Z91040 Latex allergy status: Secondary | ICD-10-CM

## 2018-07-05 DIAGNOSIS — O99214 Obesity complicating childbirth: Secondary | ICD-10-CM | POA: Diagnosis present

## 2018-07-05 DIAGNOSIS — O99314 Alcohol use complicating childbirth: Secondary | ICD-10-CM | POA: Diagnosis present

## 2018-07-05 DIAGNOSIS — O23599 Infection of other part of genital tract in pregnancy, unspecified trimester: Secondary | ICD-10-CM

## 2018-07-05 DIAGNOSIS — D649 Anemia, unspecified: Secondary | ICD-10-CM | POA: Diagnosis present

## 2018-07-05 DIAGNOSIS — O99324 Drug use complicating childbirth: Secondary | ICD-10-CM | POA: Diagnosis present

## 2018-07-05 DIAGNOSIS — O0993 Supervision of high risk pregnancy, unspecified, third trimester: Secondary | ICD-10-CM

## 2018-07-05 DIAGNOSIS — O99019 Anemia complicating pregnancy, unspecified trimester: Secondary | ICD-10-CM

## 2018-07-05 DIAGNOSIS — O09299 Supervision of pregnancy with other poor reproductive or obstetric history, unspecified trimester: Secondary | ICD-10-CM

## 2018-07-05 DIAGNOSIS — F1290 Cannabis use, unspecified, uncomplicated: Secondary | ICD-10-CM | POA: Diagnosis present

## 2018-07-05 DIAGNOSIS — O99210 Obesity complicating pregnancy, unspecified trimester: Secondary | ICD-10-CM | POA: Diagnosis present

## 2018-07-05 DIAGNOSIS — O34211 Maternal care for low transverse scar from previous cesarean delivery: Principal | ICD-10-CM | POA: Diagnosis present

## 2018-07-05 DIAGNOSIS — O99323 Drug use complicating pregnancy, third trimester: Secondary | ICD-10-CM

## 2018-07-05 DIAGNOSIS — Z3A37 37 weeks gestation of pregnancy: Secondary | ICD-10-CM | POA: Diagnosis not present

## 2018-07-05 DIAGNOSIS — A5901 Trichomonal vulvovaginitis: Secondary | ICD-10-CM

## 2018-07-05 DIAGNOSIS — O34219 Maternal care for unspecified type scar from previous cesarean delivery: Secondary | ICD-10-CM | POA: Diagnosis not present

## 2018-07-05 LAB — RPR: RPR Ser Ql: NONREACTIVE

## 2018-07-05 LAB — URINE DRUG SCREEN, QUALITATIVE (ARMC ONLY)
Amphetamines, Ur Screen: NOT DETECTED
Barbiturates, Ur Screen: NOT DETECTED
Benzodiazepine, Ur Scrn: NOT DETECTED
Cannabinoid 50 Ng, Ur ~~LOC~~: NOT DETECTED
Cocaine Metabolite,Ur ~~LOC~~: NOT DETECTED
MDMA (Ecstasy)Ur Screen: NOT DETECTED
Methadone Scn, Ur: NOT DETECTED
Opiate, Ur Screen: NOT DETECTED
Phencyclidine (PCP) Ur S: NOT DETECTED
Tricyclic, Ur Screen: NOT DETECTED

## 2018-07-05 SURGERY — Surgical Case
Anesthesia: Spinal | Site: Abdomen

## 2018-07-05 MED ORDER — ACETAMINOPHEN 325 MG PO TABS: 650.0000 mg | ORAL_TABLET | ORAL | Status: DC | PRN

## 2018-07-05 MED ORDER — SODIUM CHLORIDE 0.9% FLUSH: 3.0000 mL | INTRAVENOUS | Status: DC | PRN

## 2018-07-05 MED ORDER — NALBUPHINE HCL 10 MG/ML IJ SOLN: 5.0000 mg | Freq: Once | INTRAMUSCULAR | Status: DC | PRN

## 2018-07-05 MED ORDER — OXYCODONE-ACETAMINOPHEN 5-325 MG PO TABS
1.0000 | ORAL_TABLET | ORAL | Status: DC | PRN
  Administered 2018-07-05 – 2018-07-06 (×4): 1 via ORAL
  Filled 2018-07-05 (×4): qty 1

## 2018-07-05 MED ORDER — SOD CITRATE-CITRIC ACID 500-334 MG/5ML PO SOLN
30.0000 mL | ORAL | Status: AC
  Administered 2018-07-05: 30 mL via ORAL
  Filled 2018-07-05 (×2): qty 15

## 2018-07-05 MED ORDER — BUPIVACAINE 0.25 % ON-Q PUMP DUAL CATH 400 ML
400.0000 mL | INJECTION | Status: DC
  Filled 2018-07-05: qty 400

## 2018-07-05 MED ORDER — MORPHINE SULFATE (PF) 0.5 MG/ML IJ SOLN
INTRAMUSCULAR | Status: DC | PRN
  Administered 2018-07-05: .1 mg via EPIDURAL

## 2018-07-05 MED ORDER — ZOLPIDEM TARTRATE 5 MG PO TABS: 5.0000 mg | ORAL_TABLET | Freq: Every evening | ORAL | Status: DC | PRN

## 2018-07-05 MED ORDER — SIMETHICONE 80 MG PO CHEW: 80.0000 mg | CHEWABLE_TABLET | ORAL | Status: DC

## 2018-07-05 MED ORDER — DIPHENHYDRAMINE HCL 50 MG/ML IJ SOLN: 12.5000 mg | INTRAMUSCULAR | Status: DC | PRN

## 2018-07-05 MED ORDER — LACTATED RINGERS IV SOLN: INTRAVENOUS | Status: DC

## 2018-07-05 MED ORDER — OXYCODONE-ACETAMINOPHEN 5-325 MG PO TABS
2.0000 | ORAL_TABLET | ORAL | Status: DC | PRN
  Administered 2018-07-06 – 2018-07-07 (×6): 2 via ORAL
  Filled 2018-07-05 (×6): qty 2

## 2018-07-05 MED ORDER — PRENATAL MULTIVITAMIN CH
1.0000 | ORAL_TABLET | Freq: Every day | ORAL | Status: DC
  Administered 2018-07-06 – 2018-07-07 (×2): 1 via ORAL
  Filled 2018-07-05 (×2): qty 1

## 2018-07-05 MED ORDER — OXYTOCIN 40 UNITS IN LACTATED RINGERS INFUSION - SIMPLE MED
2.5000 [IU]/h | INTRAVENOUS | Status: DC
  Administered 2018-07-05 (×2): 2.5 [IU]/h via INTRAVENOUS
  Filled 2018-07-05: qty 1000

## 2018-07-05 MED ORDER — MORPHINE SULFATE (PF) 2 MG/ML IV SOLN: 1.0000 mg | INTRAVENOUS | Status: DC | PRN

## 2018-07-05 MED ORDER — IBUPROFEN 600 MG PO TABS
600.0000 mg | ORAL_TABLET | Freq: Four times a day (QID) | ORAL | Status: DC | PRN
  Administered 2018-07-05 – 2018-07-07 (×5): 600 mg via ORAL
  Filled 2018-07-05 (×5): qty 1

## 2018-07-05 MED ORDER — ACETAMINOPHEN 325 MG PO TABS
650.0000 mg | ORAL_TABLET | Freq: Four times a day (QID) | ORAL | Status: DC
  Administered 2018-07-05: 650 mg via ORAL
  Filled 2018-07-05: qty 2

## 2018-07-05 MED ORDER — ONDANSETRON HCL 4 MG/2ML IJ SOLN
INTRAMUSCULAR | Status: DC | PRN
  Administered 2018-07-05: 4 mg via INTRAVENOUS

## 2018-07-05 MED ORDER — METHYLERGONOVINE MALEATE 0.2 MG/ML IJ SOLN
INTRAMUSCULAR | Status: AC
  Filled 2018-07-05: qty 1

## 2018-07-05 MED ORDER — OXYCODONE HCL 5 MG PO TABS: 10.0000 mg | ORAL_TABLET | ORAL | Status: DC | PRN

## 2018-07-05 MED ORDER — FENTANYL CITRATE (PF) 100 MCG/2ML IJ SOLN
INTRAMUSCULAR | Status: AC
  Filled 2018-07-05: qty 2

## 2018-07-05 MED ORDER — DIBUCAINE 1 % RE OINT: 1.0000 "application " | TOPICAL_OINTMENT | RECTAL | Status: DC | PRN

## 2018-07-05 MED ORDER — BUPIVACAINE IN DEXTROSE 0.75-8.25 % IT SOLN
INTRATHECAL | Status: DC | PRN
  Administered 2018-07-05: 1.6 mL via INTRATHECAL

## 2018-07-05 MED ORDER — SENNOSIDES-DOCUSATE SODIUM 8.6-50 MG PO TABS
2.0000 | ORAL_TABLET | ORAL | Status: DC
  Administered 2018-07-05 – 2018-07-06 (×2): 2 via ORAL
  Filled 2018-07-05 (×2): qty 2

## 2018-07-05 MED ORDER — SIMETHICONE 80 MG PO CHEW: 80.0000 mg | CHEWABLE_TABLET | ORAL | Status: DC | PRN

## 2018-07-05 MED ORDER — LACTATED RINGERS IV SOLN
INTRAVENOUS | Status: DC
  Administered 2018-07-05: 06:00:00 via INTRAVENOUS

## 2018-07-05 MED ORDER — BUPIVACAINE HCL 0.5 % IJ SOLN
INTRAMUSCULAR | Status: DC | PRN
  Administered 2018-07-05: 10 mL

## 2018-07-05 MED ORDER — CARBOPROST TROMETHAMINE 250 MCG/ML IM SOLN
INTRAMUSCULAR | Status: AC
  Filled 2018-07-05: qty 1

## 2018-07-05 MED ORDER — BUPIVACAINE HCL (PF) 0.5 % IJ SOLN: 5.0000 mL | Freq: Once | INTRAMUSCULAR | Status: DC

## 2018-07-05 MED ORDER — ONDANSETRON HCL 4 MG/2ML IJ SOLN: 4.0000 mg | Freq: Three times a day (TID) | INTRAMUSCULAR | Status: DC | PRN

## 2018-07-05 MED ORDER — BUPIVACAINE HCL (PF) 0.5 % IJ SOLN
5.0000 mL | Freq: Once | INTRAMUSCULAR | Status: DC
  Filled 2018-07-05: qty 30

## 2018-07-05 MED ORDER — NALBUPHINE HCL 10 MG/ML IJ SOLN: 5.0000 mg | INTRAMUSCULAR | Status: DC | PRN

## 2018-07-05 MED ORDER — WITCH HAZEL-GLYCERIN EX PADS: 1.0000 "application " | MEDICATED_PAD | CUTANEOUS | Status: DC | PRN

## 2018-07-05 MED ORDER — DIPHENHYDRAMINE HCL 25 MG PO CAPS: 25.0000 mg | ORAL_CAPSULE | ORAL | Status: DC | PRN

## 2018-07-05 MED ORDER — OXYTOCIN 40 UNITS IN LACTATED RINGERS INFUSION - SIMPLE MED
INTRAVENOUS | Status: DC | PRN
  Administered 2018-07-05: 500 mL via INTRAVENOUS

## 2018-07-05 MED ORDER — MENTHOL 3 MG MT LOZG
1.0000 | LOZENGE | OROMUCOSAL | Status: DC | PRN
  Filled 2018-07-05: qty 9

## 2018-07-05 MED ORDER — TETANUS-DIPHTH-ACELL PERTUSSIS 5-2.5-18.5 LF-MCG/0.5 IM SUSP: 0.5000 mL | Freq: Once | INTRAMUSCULAR | Status: DC

## 2018-07-05 MED ORDER — DIPHENHYDRAMINE HCL 25 MG PO CAPS: 25.0000 mg | ORAL_CAPSULE | Freq: Four times a day (QID) | ORAL | Status: DC | PRN

## 2018-07-05 MED ORDER — COCONUT OIL OIL: 1.0000 "application " | TOPICAL_OIL | Status: DC | PRN

## 2018-07-05 MED ORDER — KETOROLAC TROMETHAMINE 30 MG/ML IJ SOLN
INTRAMUSCULAR | Status: AC
  Filled 2018-07-05: qty 1

## 2018-07-05 MED ORDER — FENTANYL CITRATE (PF) 100 MCG/2ML IJ SOLN
INTRAMUSCULAR | Status: DC | PRN
  Administered 2018-07-05: 15 ug via INTRATHECAL

## 2018-07-05 MED ORDER — KETOROLAC TROMETHAMINE 30 MG/ML IJ SOLN: 30.0000 mg | Freq: Four times a day (QID) | INTRAMUSCULAR | Status: DC

## 2018-07-05 MED ORDER — KETOROLAC TROMETHAMINE 30 MG/ML IJ SOLN
30.0000 mg | Freq: Four times a day (QID) | INTRAMUSCULAR | Status: DC
  Administered 2018-07-05 (×2): 30 mg via INTRAVENOUS
  Filled 2018-07-05: qty 1

## 2018-07-05 MED ORDER — OXYTOCIN 40 UNITS IN LACTATED RINGERS INFUSION - SIMPLE MED
INTRAVENOUS | Status: AC
  Filled 2018-07-05: qty 1000

## 2018-07-05 MED ORDER — ONDANSETRON HCL 4 MG/2ML IJ SOLN
INTRAMUSCULAR | Status: AC
  Filled 2018-07-05: qty 2

## 2018-07-05 MED ORDER — OXYCODONE HCL 5 MG PO TABS
5.0000 mg | ORAL_TABLET | ORAL | Status: DC | PRN
  Administered 2018-07-05: 5 mg via ORAL
  Filled 2018-07-05: qty 1

## 2018-07-05 MED ORDER — EPHEDRINE SULFATE-NACL 50-0.9 MG/10ML-% IV SOSY
PREFILLED_SYRINGE | INTRAVENOUS | Status: DC | PRN
  Administered 2018-07-05: 10 mg via INTRAVENOUS

## 2018-07-05 MED ORDER — MORPHINE SULFATE (PF) 0.5 MG/ML IJ SOLN
INTRAMUSCULAR | Status: AC
  Filled 2018-07-05: qty 10

## 2018-07-05 MED ORDER — NALOXONE HCL 0.4 MG/ML IJ SOLN: 0.4000 mg | INTRAMUSCULAR | Status: DC | PRN

## 2018-07-05 MED ORDER — SODIUM CHLORIDE 0.9 % IV SOLN
INTRAVENOUS | Status: DC | PRN
  Administered 2018-07-05: 50 ug/min via INTRAVENOUS

## 2018-07-05 MED ORDER — SIMETHICONE 80 MG PO CHEW
80.0000 mg | CHEWABLE_TABLET | Freq: Three times a day (TID) | ORAL | Status: DC
  Administered 2018-07-05 – 2018-07-07 (×6): 80 mg via ORAL
  Filled 2018-07-05 (×6): qty 1

## 2018-07-05 SURGICAL SUPPLY — 29 items
BARRIER ADHS 3X4 INTERCEED (GAUZE/BANDAGES/DRESSINGS) ×1 IMPLANT
CANISTER SUCT 3000ML PPV (MISCELLANEOUS) ×2 IMPLANT
CATH KIT ON-Q SILVERSOAK 5 (CATHETERS) ×2 IMPLANT
CATH KIT ON-Q SILVERSOAK 5IN (CATHETERS) ×4 IMPLANT
CHLORAPREP W/TINT 26ML (MISCELLANEOUS) ×4 IMPLANT
COVER WAND RF STERILE (DRAPES) ×2 IMPLANT
DERMABOND ADVANCED (GAUZE/BANDAGES/DRESSINGS) ×1
DERMABOND ADVANCED .7 DNX12 (GAUZE/BANDAGES/DRESSINGS) ×1 IMPLANT
DRESSING SURGICEL FIBRLLR 1X2 (HEMOSTASIS) IMPLANT
DRSG OPSITE POSTOP 4X10 (GAUZE/BANDAGES/DRESSINGS) ×2 IMPLANT
DRSG SURGICEL FIBRILLAR 1X2 (HEMOSTASIS) ×2
ELECT CAUTERY BLADE 6.4 (BLADE) ×2 IMPLANT
ELECT REM PT RETURN 9FT ADLT (ELECTROSURGICAL) ×2
ELECTRODE REM PT RTRN 9FT ADLT (ELECTROSURGICAL) ×1 IMPLANT
GLOVE SKINSENSE NS SZ8.0 LF (GLOVE) ×1
GLOVE SKINSENSE STRL SZ8.0 LF (GLOVE) ×1 IMPLANT
GOWN STRL REUS W/ TWL LRG LVL3 (GOWN DISPOSABLE) ×1 IMPLANT
GOWN STRL REUS W/ TWL XL LVL3 (GOWN DISPOSABLE) ×2 IMPLANT
GOWN STRL REUS W/TWL LRG LVL3 (GOWN DISPOSABLE) ×1
GOWN STRL REUS W/TWL XL LVL3 (GOWN DISPOSABLE) ×2
NS IRRIG 1000ML POUR BTL (IV SOLUTION) ×2 IMPLANT
PACK C SECTION AR (MISCELLANEOUS) ×2 IMPLANT
PAD OB MATERNITY 4.3X12.25 (PERSONAL CARE ITEMS) ×2 IMPLANT
PAD PREP 24X41 OB/GYN DISP (PERSONAL CARE ITEMS) ×2 IMPLANT
SUT MAXON ABS #0 GS21 30IN (SUTURE) ×4 IMPLANT
SUT PLAIN 2 0 XLH (SUTURE) ×1 IMPLANT
SUT VIC AB 1 CT1 36 (SUTURE) ×11 IMPLANT
SUT VIC AB 2-0 CT1 36 (SUTURE) ×2 IMPLANT
SUT VIC AB 4-0 FS2 27 (SUTURE) ×2 IMPLANT

## 2018-07-05 NOTE — Lactation Note (Signed)
Lactation Consultation Note  Patient Name: Ahja Martello DUPBD'H Date: 07/05/2018 Reason for consult: Initial assessment(MJ use during pregnancy)   Mom describes what sounds like effective breastfeeds with strong sucks and many swallows and no nipple pain. She says baby has fallen asleep after each feed. Teressa Senter has already had one void and one stool. He fed within the last 90 minutes and is now sleeping , so no assessment done.   Mom c/o problems breastfeeding her now 32 year old. She said in hospital baby had "fed well" (no pain), then a few days later, sore and bleeding nipples (when breasts became engorged). She nursed and pumped for 3 weeks which allowed him to gain weight quite well, and then "he got fussy" at 3 weeks so she gave him formula.   I explained what may be likely causes of her problems with first, so she can avoid them with this baby. I instructed her on milk supply, quality latch, softening areola and preventing engorgement, signs of well fed baby, and growth spurts. Because she had tested + for MJ, I reviewed the Academy of Breastfeeding Medicine #21 recommendations on breastfeeding and marijuana. She told me she "does not plan to use it any more".   I gave her a breast pump kit to use later on for return to work. She plans to obtain [facility] pump later on.      Maternal Data Has patient been taught Hand Expression?: Yes Does the patient have breastfeeding experience prior to this delivery?: Yes  Feeding    LATCH Score                   Interventions Interventions: Breast feeding basics reviewed(discussed growth spurts; sufficient milk; MJ; proper latch)  Lactation Tools Discussed/Used WIC Program: Yes Pump Review: (mom not ready)   Consult Status Consult Status: Follow-up Date: 07/06/18    Roque Cash 07/05/2018, 4:29 PM

## 2018-07-05 NOTE — Anesthesia Preprocedure Evaluation (Signed)
Anesthesia Evaluation  Patient identified by MRN, date of birth, ID band Patient awake    Reviewed: Allergy & Precautions, NPO status , Patient's Chart, lab work & pertinent test results  History of Anesthesia Complications Negative for: history of anesthetic complications  Airway Mallampati: II  TM Distance: >3 FB Neck ROM: Full    Dental no notable dental hx.    Pulmonary neg sleep apnea, neg COPD, former smoker,    breath sounds clear to auscultation- rhonchi (-) wheezing      Cardiovascular (-) hypertension(-) CAD, (-) Past MI, (-) Cardiac Stents and (-) CABG  Rhythm:Regular Rate:Normal - Systolic murmurs and - Diastolic murmurs    Neuro/Psych negative neurological ROS  negative psych ROS   GI/Hepatic negative GI ROS, Neg liver ROS,   Endo/Other  negative endocrine ROSneg diabetes  Renal/GU negative Renal ROS     Musculoskeletal negative musculoskeletal ROS (+)   Abdominal (+) + obese,   Peds  Hematology  (+) anemia ,   Anesthesia Other Findings Past Medical History: No date: Anemia   Reproductive/Obstetrics (+) Pregnancy                             Lab Results  Component Value Date   WBC 9.6 07/04/2018   HGB 10.8 (L) 07/04/2018   HCT 33.6 (L) 07/04/2018   MCV 75.0 (L) 07/04/2018   PLT 295 07/04/2018    Anesthesia Physical Anesthesia Plan  ASA: II  Anesthesia Plan: Spinal   Post-op Pain Management:    Induction:   PONV Risk Score and Plan: 2 and Ondansetron  Airway Management Planned: Natural Airway  Additional Equipment:   Intra-op Plan:   Post-operative Plan:   Informed Consent: I have reviewed the patients History and Physical, chart, labs and discussed the procedure including the risks, benefits and alternatives for the proposed anesthesia with the patient or authorized representative who has indicated his/her understanding and acceptance.   Dental  advisory given  Plan Discussed with: CRNA and Anesthesiologist  Anesthesia Plan Comments:         Anesthesia Quick Evaluation

## 2018-07-05 NOTE — Op Note (Signed)
Cesarean Section Procedure Note Indications: prior cesarean section and term intrauterine pregnancy  Pre-operative Diagnosis: Intrauterine pregnancy 5052w0d ;  prior cesarean section and term intrauterine pregnancy Post-operative Diagnosis: same, delivered. Procedure: Low Transverse Cesarean Section Surgeon: Annamarie MajorPaul Spruha Weight, MD, FACOG Assistant(s): Dr Jerene PitchSchuman, No other capable assistant available, in surgery requiring high level assistant. Anesthesia: Spinal anesthesia Estimated Blood Loss:500 Complications: None; patient tolerated the procedure well. Disposition: PACU - hemodynamically stable. Condition: stable  Findings: A female infant in the cephalic presentation. Amniotic fluid - Clear  Birth weight 6-2 lbs.  Apgars of 9 and 9.  Intact placenta with a three-vessel cord. Grossly normal uterus, tubes and ovaries bilaterally. There were significant intraabdominal adhesions noted along the lower uterine segment with abdominal wall; bladder was well protected and out of harms way.  No bowel adhesions.  Procedure Details   The patient was taken to Operating Room, identified as the correct patient and the procedure verified as C-Section Delivery. A Time Out was held and the above information confirmed. After induction of anesthesia, the patient was draped and prepped in the usual sterile manner. A Pfannenstiel incision was made and carried down through the subcutaneous tissue to the fascia. Fascial incision was made and extended transversely with the Mayo scissors. The fascia was separated from the underlying rectus tissue superiorly and inferiorly. The peritoneum was identified and entered bluntly. Peritoneal incision was extended longitudinally. The utero-vesical peritoneal reflection was incised transversely and a bladder flap was created digitally.  A low transverse hysterotomy was made. The fetus was delivered atraumatically. The umbilical cord was clamped x2 and cut and the infant was handed to  the awaiting pediatricians. The placenta was removed intact and appeared normal with a 3-vessel cord.  The uterus was exteriorized and cleared of all clot and debris. The hysterotomy was closed with running sutures of 0 Vicryl suture. A second imbricating layer was placed with the same suture. Excellent hemostasis was observed. The uterus was returned to the abdomen. The pelvis was irrigated and again, excellent hemostasis was noted.  Suture placed to aid hemostasis, along w Fibralar.  Interceed placed to minimize adhesion formation.  The On Q Pain pump System was then placed.  Trocars were placed through the abdominal wall into the subfascial space and these were used to thread the silver soaker cathaters into place.The rectus fascia was then reapproximated with running sutures of Maxon, with careful placement not to incorporate the cathaters. Subcutaneous tissues are then irrigated with saline and hemostasis assured.  Skin is then closed with 4-0 vicryl suture in a subcuticular fashion followed by skin adhesive. The cathaters are flushed each with 5 mL of Bupivicaine and stabilized into place with dressing. Instrument, sponge, and needle counts were correct prior to the abdominal closure and at the conclusion of the case.  The patient tolerated the procedure well and was transferred to the recovery room in stable condition.   Annamarie MajorPaul Dalina Samara, MD, Merlinda FrederickFACOG [facility], [facility] 07/05/2018  8:58 AM

## 2018-07-05 NOTE — Interval H&P Note (Signed)
History and Physical Interval Note:  07/05/2018 6:58 AM  Nichole Berry  has presented today for surgery, with the diagnosis of HISTORY OF CSECTION and now TERM PREGNANCY The various methods of treatment have been discussed with the patient and family. After consideration of risks, benefits and other options for treatment, the patient has consented to  Procedure(s): REPEAT CESAREAN SECTION (N/A) as a surgical intervention .  The patient's history has been reviewed, patient examined, no change in status, stable for surgery.  I have reviewed the patient's chart and labs.  Questions were answered to the patient's satisfaction.    Annamarie MajorPaul Kumiko Fishman, MD, Merlinda FrederickFACOG [facility], [facility] 07/05/2018  7:00 AM

## 2018-07-05 NOTE — Anesthesia Procedure Notes (Signed)
Spinal  Patient location during procedure: OR Start time: 07/05/2018 7:30 AM End time: 07/05/2018 7:45 AM Staffing Anesthesiologist: Emmie Niemann, MD Performed: anesthesiologist  Preanesthetic Checklist Completed: patient identified, site marked, surgical consent, pre-op evaluation, timeout performed, IV checked, risks and benefits discussed and monitors and equipment checked Spinal Block Patient position: sitting Prep: ChloraPrep Patient monitoring: heart rate, continuous pulse ox, blood pressure and cardiac monitor Approach: midline Location: L4-5 Injection technique: single-shot Needle Needle type: Introducer and Pencil-Tip  Needle gauge: 24 G Needle length: 9 cm Additional Notes Negative paresthesia. Negative blood return. Positive free-flowing CSF. Expiration date of kit checked and confirmed. Patient tolerated procedure well, without complications.

## 2018-07-05 NOTE — Discharge Instructions (Signed)

## 2018-07-05 NOTE — Anesthesia Post-op Follow-up Note (Signed)
Anesthesia QCDR form completed.        

## 2018-07-05 NOTE — Transfer of Care (Signed)
Immediate Anesthesia Transfer of Care Note  Patient: Nichole Berry  Procedure(s) Performed: REPEAT CESAREAN SECTION (N/A Abdomen)  Patient Location: PACU  Anesthesia Type:General  Level of Consciousness: awake, alert  and oriented  Airway & Oxygen Therapy: Patient Spontanous Breathing  Post-op Assessment: Report given to RN and Post -op Vital signs reviewed and stable  Post vital signs: Reviewed and stable  Last Vitals:  Vitals Value Taken Time  BP    Temp 36.3 C 07/05/2018  9:00 AM  Pulse    Resp    SpO2      Last Pain:  Vitals:   07/05/18 0900  TempSrc: Oral         Complications: No apparent anesthesia complications

## 2018-07-05 NOTE — Discharge Summary (Signed)
OB Discharge Summary     Patient Name: Nichole Berry DOB: 1986-02-21 MRN: [id number]  Date of admission: 07/05/2018 Delivering MD: Farrel Conners, CNM  Date of Delivery: 07/05/2018  Date of discharge: 07/07/2018 Admitting diagnosis: HISTORY OF CSECTION Intrauterine pregnancy: [redacted]w[redacted]d     Secondary diagnosis: Term pregnancy, Prior cesarean, Obesity with BMI>50     Discharge diagnosis: Term Pregnancy Delivered, Reasons for cesarean section  Elective repeat                         Hospital course:  Sceduled C/S   32 y.o. yo G3P3003 at [redacted]w[redacted]d was admitted to the hospital 07/05/2018 for scheduled cesarean section with the following indication:Elective Repeat.  Membrane Rupture Time/Date: 8:01 AM ,07/05/2018   Patient delivered a Viable infant.07/05/2018  Details of operation can be found in separate operative note.  Pateint had an uncomplicated postpartum course.  She is ambulating, tolerating a regular diet, passing flatus, and urinating well. Patient is discharged home in stable condition on  07/07/2018                                                                       Post partum procedures:Varivax, Depo  Complications: None  Physical exam on 07/07/2018: Vitals:   07/06/18 1125 07/06/18 1549 07/06/18 2043 07/07/18 0235  BP: (!) 97/53 (!) 96/54 119/63 108/60  Pulse: 80 77 83 80  Resp: 18 18 18 18   Temp: 98.5 F (36.9 C) 98 F (36.7 C) 97.9 F (36.6 C) 97.9 F (36.6 C)  TempSrc: Oral Oral Oral Oral  SpO2: 98% 97%  98%  Weight:      Height:       General: alert, cooperative and no distress, desires discharge Has decided not to breast feed Heart: RRR with Grade II/VI systolic murmur best heard in pulmonic area Lungs: CTA/ normal respiratory effort Lochia: appropriate Uterine Fundus: firm Incision: Healing well with no significant drainage. Honey comb dressing intact. ON Q intact DVT Evaluation: No evidence of DVT seen on physical exam. Trace ankle and pedal  edema.  Labs: Lab Results  Component Value Date   WBC 11.2 (H) 07/06/2018   HGB 9.9 (L) 07/06/2018   HCT 30.8 (L) 07/06/2018   MCV 75.3 (L) 07/06/2018   PLT 280 07/06/2018   No flowsheet data found.  Discharge instruction: per After Visit Summary.  Medications:  Allergies as of 07/07/2018      Reactions   Latex Hives      Medication List    TAKE these medications   ferrous sulfate 325 (65 FE) MG tablet Take 1 tablet (325 mg total) by mouth daily with breakfast. What changed:  when to take this   ibuprofen 600 MG tablet Commonly known as:  ADVIL,MOTRIN Take 1 tablet (600 mg total) by mouth every 6 (six) hours as needed for fever or headache.   multivitamin-prenatal 27-0.8 MG Tabs tablet Take 1 tablet by mouth daily.   oxyCODONE-acetaminophen 5-325 MG tablet Commonly known as:  PERCOCET/ROXICET Take 1 tablet by mouth every 6 (six) hours as needed for up to 5 days for severe pain.       Diet: routine diet  Activity: Advance as tolerated. Pelvic rest for 6 weeks.  Outpatient follow up: Follow-up Information    Nadara MustardHarris, Robert P, MD. Schedule an appointment as soon as possible for a visit in 1 week(s).   Specialty:  Obstetrics and Gynecology Contact information: [address] [place] [address] [phone]             Postpartum contraception: Depo Provera Rhogam Given postpartum: yes Rubella vaccine given postpartum: no Varicella vaccine given postpartum: yes TDaP given antepartum or postpartum: AP  Newborn Data: Live born female / Lonni Fixolan Birth Weight: 6 lb 1.7 oz (2770 g) APGAR: 9, 9  Newborn Delivery   Birth date/time:  07/05/2018 08:02:00 Delivery type:  C-Section, Low Transverse Trial of labor:  No C-section categorization:  Repeat      Baby Feeding: Bottle  Disposition:home with mother  SIGNED:  Farrel ConnersColleen Anvi Mangal, CNM 07/07/2018 9:00 AM

## 2018-07-05 NOTE — Anesthesia Procedure Notes (Signed)
Procedure Name: MAC Performed by: Amad Mau, CRNA Pre-anesthesia Checklist: Patient identified, Emergency Drugs available, Suction available, Patient being monitored and Timeout performed Oxygen Delivery Method: Nasal cannula       

## 2018-07-06 DIAGNOSIS — O34219 Maternal care for unspecified type scar from previous cesarean delivery: Secondary | ICD-10-CM

## 2018-07-06 DIAGNOSIS — Z3A39 39 weeks gestation of pregnancy: Secondary | ICD-10-CM

## 2018-07-06 LAB — CBC
HEMATOCRIT: 30.8 % — AB (ref 36.0–46.0)
HEMOGLOBIN: 9.9 g/dL — AB (ref 12.0–15.0)
MCH: 24.2 pg — AB (ref 26.0–34.0)
MCHC: 32.1 g/dL (ref 30.0–36.0)
MCV: 75.3 fL — ABNORMAL LOW (ref 80.0–100.0)
Platelets: 280 10*3/uL (ref 150–400)
RBC: 4.09 MIL/uL (ref 3.87–5.11)
RDW: 15.3 % (ref 11.5–15.5)
WBC: 11.2 10*3/uL — ABNORMAL HIGH (ref 4.0–10.5)
nRBC: 0 % (ref 0.0–0.2)

## 2018-07-06 MED ORDER — VARICELLA VIRUS VACCINE LIVE 1350 PFU/0.5ML IJ SUSR
0.5000 mL | INTRAMUSCULAR | Status: AC | PRN
  Administered 2018-07-07: 0.5 mL via SUBCUTANEOUS
  Filled 2018-07-06: qty 0.5

## 2018-07-06 NOTE — Progress Notes (Signed)
Admit Date: 07/05/2018 Today's Date: 07/06/2018  Subjective: Postpartum Day 1: Cesarean Delivery Patient reports tolerating PO, + flatus and no problems voiding.    Objective: Vital signs in last 24 hours: Temp:  [97.4 F (36.3 C)-98.2 F (36.8 C)] 98.2 F (36.8 C) (11/20 0824) Pulse Rate:  [57-102] 75 (11/20 0824) Resp:  [8-35] 20 (11/20 0824) BP: (94-115)/(52-68) 115/64 (11/20 0824) SpO2:  [98 %-100 %] 99 % (11/20 0824)  Physical Exam:  General: alert and cooperative Lochia: appropriate Uterine Fundus: firm Incision: healing well, no significant drainage, no dehiscence, no significant erythema DVT Evaluation: No evidence of DVT seen on physical exam.  Recent Labs    07/04/18 1300 07/06/18 0521  HGB 10.8* 9.9*  HCT 33.6* 30.8*    Assessment/Plan: Status post Cesarean section. Doing well postoperatively.  Continue current care. Discussed birth control options, she does not desire birth control.  Breast feeding No issue.  Rh positive Rubella Immune Varicella non-immune- vaccination ordered for prior to discharge. Tdap and Flu up to date.     Christanna R Schuman 07/06/2018, 8:35 AM

## 2018-07-06 NOTE — Anesthesia Postprocedure Evaluation (Signed)
Anesthesia Post Note  Patient: Tonette LedererMartina Zulauf  Procedure(s) Performed: REPEAT CESAREAN SECTION (N/A Abdomen)  Patient location during evaluation: Nursing Unit Anesthesia Type: Spinal Level of consciousness: awake, awake and alert and oriented Pain management: pain level controlled Vital Signs Assessment: post-procedure vital signs reviewed and stable Respiratory status: spontaneous breathing Cardiovascular status: blood pressure returned to baseline and stable Postop Assessment: no headache Anesthetic complications: no Comments: Sitting in bed, has showered and voided.  No complaints.     Last Vitals:  Vitals:   07/05/18 2336 07/06/18 0824  BP: (!) 94/52 115/64  Pulse: 79 75  Resp: 18 20  Temp: 36.7 C 36.8 C  SpO2: 100% 99%    Last Pain:  Vitals:   07/06/18 0826  TempSrc:   PainSc: 6                  Vernie MurdersPope,  Johannes Everage G

## 2018-07-06 NOTE — Lactation Note (Signed)
This note was copied from a baby's chart. Lactation Consultation Note  Patient Name: Nichole Berry JXBJY'NToday's Date: 07/06/2018 Reason for consult: Initial assessment   Maternal Data    Feeding Feeding Type: Breast Fed  LATCH Score                   Interventions    Lactation Tools Discussed/Used     Consult Status Consult Status: Follow-up Date: 07/06/18 Follow-up type: In-patient  Mother states that infant just finished breastfeeding for 45 minutes and still appears to be hungry and showing cues. Mother denies pain but states that infant constantly falls asleep at the breast. LC did an oral assessment and noticed that infant may have a possible tongue restriction. LC provided mother with a breast pump and instructed her to start pumping for 15 minutes every 2-3 hours. Mother states that she got out a little colostrum and fed to infant. Mother has WIC, does not have a breast pump and has called to make an appointment.    Arlyss Gandylicia Bertis Hustead 07/06/2018, 4:58 PM

## 2018-07-07 ENCOUNTER — Encounter: Payer: Self-pay | Admitting: Certified Nurse Midwife

## 2018-07-07 DIAGNOSIS — Z3A39 39 weeks gestation of pregnancy: Secondary | ICD-10-CM

## 2018-07-07 DIAGNOSIS — O34219 Maternal care for unspecified type scar from previous cesarean delivery: Secondary | ICD-10-CM

## 2018-07-07 MED ORDER — OXYCODONE-ACETAMINOPHEN 5-325 MG PO TABS: 1.0000 | ORAL_TABLET | Freq: Four times a day (QID) | ORAL | 0 refills | Status: DC | PRN

## 2018-07-07 MED ORDER — IBUPROFEN 600 MG PO TABS: 600.0000 mg | ORAL_TABLET | Freq: Four times a day (QID) | ORAL | 0 refills | Status: DC | PRN

## 2018-07-07 MED ORDER — FERROUS SULFATE 325 (65 FE) MG PO TABS: 325.0000 mg | ORAL_TABLET | Freq: Every day | ORAL | Status: DC

## 2018-07-07 MED ORDER — MEDROXYPROGESTERONE ACETATE 150 MG/ML IM SUSP
150.0000 mg | Freq: Once | INTRAMUSCULAR | Status: AC
  Administered 2018-07-07: 150 mg via INTRAMUSCULAR
  Filled 2018-07-07 (×2): qty 1

## 2018-07-10 ENCOUNTER — Other Ambulatory Visit: Payer: Self-pay

## 2018-07-11 ENCOUNTER — Encounter: Payer: Self-pay | Admitting: Obstetrics and Gynecology

## 2018-07-11 ENCOUNTER — Ambulatory Visit (INDEPENDENT_AMBULATORY_CARE_PROVIDER_SITE_OTHER): Payer: Medicaid Other | Admitting: Obstetrics and Gynecology

## 2018-07-11 VITALS — BP 128/88

## 2018-07-11 DIAGNOSIS — Z09 Encounter for follow-up examination after completed treatment for conditions other than malignant neoplasm: Secondary | ICD-10-CM

## 2018-07-11 DIAGNOSIS — G8918 Other acute postprocedural pain: Secondary | ICD-10-CM

## 2018-07-11 MED ORDER — OXYCODONE-ACETAMINOPHEN 5-325 MG PO TABS: 1.0000 | ORAL_TABLET | Freq: Two times a day (BID) | ORAL | 0 refills | Status: DC | PRN

## 2018-07-11 NOTE — Progress Notes (Signed)
   Postoperative Follow-up Patient presents post op from cesarean section  6 days ago.  Subjective: She denies fever, chills, nausea and vomiting. Eating a regular diet without difficulty. Pain: she continues to have pain. She just ran out of percocet and states that she still needs it for sleep.  She is taking ibuprofen.  Activity: increasing slowly. She denies issues with her incision.    Objective: BP 128/88   LMP 10/05/2017   Constitutional: Well nourished, well developed female in no acute distress.  HEENT: normal Skin: Warm and dry.  Abdomen: S/NT/ND/fundus at U-4 clean, dry, intact and without erythema, induration, warmth, and tenderness Extremity: no edema   Assessment: 32 y.o. s/p cesarean section progressing well. Still with some pain issues.   Plan: Patient has done well after surgery with no apparent complications.  I have discussed the post-operative course to date, and the expected progress moving forward.  The patient understands what complications to be concerned about.    Activity plan: increase slowly.  Incision care instructions reviewed. Small number of percocet supplied   Return in about 5 weeks (around 08/15/2018) for Six Week Postpartum with Dr. Tiburcio PeaHarris.  Thomasene MohairStephen Jackson, MD 07/11/2018 11:59 AM

## 2018-07-11 NOTE — Telephone Encounter (Signed)
Please advise 

## 2018-07-12 NOTE — Telephone Encounter (Signed)
Please schedule today with Tiburcio PeaHarris per Tiburcio PeaHarris.

## 2018-07-12 NOTE — Telephone Encounter (Signed)
Appt today to check on status and pain (work in there)

## 2018-07-12 NOTE — Telephone Encounter (Signed)
Phone number is currently disconnected. Sent message thru Mychart. Waiting for patient action.

## 2018-07-13 ENCOUNTER — Ambulatory Visit: Payer: Medicaid Other | Admitting: Obstetrics and Gynecology

## 2018-07-13 MED ORDER — OXYCODONE-ACETAMINOPHEN 5-325 MG PO TABS: 1.0000 | ORAL_TABLET | Freq: Four times a day (QID) | ORAL | 0 refills | Status: AC | PRN

## 2018-07-13 NOTE — Telephone Encounter (Signed)
Patient is schedule 08/15/18 with [facility] for 6 week PP

## 2018-07-16 LAB — ABO/RH: ABO/RH(D): O POS

## 2018-08-15 ENCOUNTER — Encounter: Payer: Self-pay | Admitting: Obstetrics & Gynecology

## 2018-08-15 ENCOUNTER — Other Ambulatory Visit (HOSPITAL_COMMUNITY)
Admission: RE | Admit: 2018-08-15 | Discharge: 2018-08-15 | Disposition: A | Discharge status: Home / Self Care | Payer: Medicaid Other | Source: Ambulatory Visit | Attending: Obstetrics & Gynecology | Admitting: Obstetrics & Gynecology

## 2018-08-15 ENCOUNTER — Ambulatory Visit (INDEPENDENT_AMBULATORY_CARE_PROVIDER_SITE_OTHER): Payer: Medicaid Other | Admitting: Obstetrics & Gynecology

## 2018-08-15 DIAGNOSIS — Z124 Encounter for screening for malignant neoplasm of cervix: Secondary | ICD-10-CM | POA: Insufficient documentation

## 2018-08-15 DIAGNOSIS — Z1389 Encounter for screening for other disorder: Secondary | ICD-10-CM | POA: Diagnosis not present

## 2018-08-15 MED ORDER — MEDROXYPROGESTERONE ACETATE 150 MG/ML IM SUSP: 150.0000 mg | INTRAMUSCULAR | 3 refills | Status: DC

## 2018-08-15 MED ORDER — SERTRALINE HCL 50 MG PO TABS: 50.0000 mg | ORAL_TABLET | Freq: Every day | ORAL | 5 refills | Status: DC

## 2018-08-15 NOTE — Progress Notes (Signed)
  OBSTETRICS POSTPARTUM CLINIC PROGRESS NOTE  Subjective:     Nichole Berry is a 32 y.o. 123P3003 female who presents for a postpartum visit. She is 6 weeks postpartum following a Term pregnancy and delivery by C-section.  I have fully reviewed the prenatal and intrapartum course. Anesthesia: spinal.  Postpartum course has been complicated by complicated by mild depression.  Baby is feeding by Bottle.  Bleeding: patient has  resumed menses.  Bowel function is normal. Bladder function is normal.  Patient is not sexually active. Contraception method desired is Depo-Provera injections.  Postpartum depression screening: borderline; feels it interferes w daily functioning. Edinburgh 7.  The following portions of the patient's history were reviewed and updated as appropriate: allergies, current medications, past family history, past medical history, past social history, past surgical history and problem list.  Review of Systems Pertinent items are noted in HPI.  Objective:    BP 120/80   Ht 5\' 6"  (1.676 m)   Wt (!) 313 lb (142 kg)   LMP 08/06/2018   BMI 50.52 kg/m   General:  alert and no distress   Breasts:  inspection negative, no nipple discharge or bleeding, no masses or nodularity palpable  Lungs: clear to auscultation bilaterally  Heart:  regular rate and rhythm, S1, S2 normal, no murmur, click, rub or gallop  Abdomen: soft, non-tender; bowel sounds normal; no masses,  no organomegaly.  Well healed Pfannenstiel incision   Vulva:  normal  Vagina: normal vagina, no discharge, exudate, lesion, or erythema  Cervix:  no cervical motion tenderness and no lesions  Corpus: normal size, contour, position, consistency, mobility, non-tender  Adnexa:  normal adnexa and no mass, fullness, tenderness  Rectal Exam: Not performed.          Assessment:  Post Partum Care visit 1. Postpartum care following cesarean delivery  2. Screening for cervical cancer - Cytology - PAP   Plan:  See  orders and Patient Instructions Contraceptive counseling for Depo-Provera Zoloft for PPD, may need to see PCP or specialist (prior h/o multiple meds for mood disorder Follow up in: 6 weeks or as needed.   Annamarie MajorPaul Yehya Brendle, MD, Merlinda FrederickFACOG [facility], [facility] 08/15/2018  11:16 AM

## 2018-08-15 NOTE — Patient Instructions (Signed)

## 2018-08-19 ENCOUNTER — Encounter: Payer: Self-pay | Admitting: Obstetrics & Gynecology

## 2018-08-19 LAB — CYTOLOGY - PAP
DIAGNOSIS: NEGATIVE
HPV (WINDOPATH): NOT DETECTED

## 2018-09-06 ENCOUNTER — Other Ambulatory Visit: Payer: Self-pay

## 2018-09-06 ENCOUNTER — Encounter: Payer: Self-pay | Admitting: Emergency Medicine

## 2018-09-06 ENCOUNTER — Emergency Department
Admission: EM | Admit: 2018-09-06 | Discharge: 2018-09-06 | Disposition: A | Discharge status: Home / Self Care | Payer: Medicaid Other | Attending: Student in an Organized Health Care Education/Training Program | Admitting: Student in an Organized Health Care Education/Training Program

## 2018-09-06 DIAGNOSIS — Z79899 Other long term (current) drug therapy: Secondary | ICD-10-CM | POA: Insufficient documentation

## 2018-09-06 DIAGNOSIS — L732 Hidradenitis suppurativa: Secondary | ICD-10-CM

## 2018-09-06 DIAGNOSIS — Z87891 Personal history of nicotine dependence: Secondary | ICD-10-CM | POA: Insufficient documentation

## 2018-09-06 DIAGNOSIS — J029 Acute pharyngitis, unspecified: Secondary | ICD-10-CM | POA: Diagnosis present

## 2018-09-06 DIAGNOSIS — J020 Streptococcal pharyngitis: Secondary | ICD-10-CM | POA: Diagnosis not present

## 2018-09-06 LAB — GROUP A STREP BY PCR: GROUP A STREP BY PCR: DETECTED — AB

## 2018-09-06 MED ORDER — AMOXICILLIN 500 MG PO CAPS
500.0000 mg | ORAL_CAPSULE | Freq: Once | ORAL | Status: AC
  Administered 2018-09-06: 500 mg via ORAL
  Filled 2018-09-06: qty 1

## 2018-09-06 MED ORDER — AMOXICILLIN 500 MG PO CAPS: 500.0000 mg | ORAL_CAPSULE | Freq: Three times a day (TID) | ORAL | 0 refills | Status: DC

## 2018-09-06 NOTE — ED Triage Notes (Signed)
Says draining from under arms for few weeks.  Says went to [facility] couple days ago but did not wait to be seen.  Today has sore throat.

## 2018-09-06 NOTE — ED Notes (Signed)
See triage note  Presents with sore throat for couple of days  Having increased pain with swallowing and speech is slightly muffled  No fever  Also having some possible abscess areas under both arms  States right is worse than left

## 2018-09-06 NOTE — Discharge Instructions (Signed)
Follow-up with your primary care provider if any continued problems.  You are contagious for 24 hours after starting the antibiotic.  Also as we discussed throw your toothbrush away after you have been on the antibiotic for several days.  May take Tylenol or ibuprofen as needed for throat pain or fever.  Also consider seeing a surgeon if you wish to have surgery on your underarm area.  At this time use warm compresses and avoid using deodorant.

## 2018-09-06 NOTE — ED Provider Notes (Signed)
[facility] Emergency Department Provider Note  ____________________________________________   First MD Initiated Contact with Patient 09/06/18 1006     (approximate)  I have reviewed the triage vital signs and the nursing notes.   HISTORY  Chief Complaint Sore Throat and Abscess   HPI Nichole Berry is a 33 y.o. female presents to the ED with complaint of sore throat.  She is also here to have her axilla evaluated.  She states that she has had problems off and on with draining from her underarms.  She states that she went to [facility] several days ago but left before being seen.  She states that her son was seen at that time diagnosed with strep throat.  Rates her pain as 9/10.   Past Medical History:  Diagnosis Date  . Anemia     Patient Active Problem List   Diagnosis Date Noted  . Delivery of pregnancy by cesarean section 07/07/2018  . Postpartum care following cesarean delivery 07/07/2018  . History of cesarean section 06/23/2018    Past Surgical History:  Procedure Laterality Date  . CESAREAN SECTION  2006, 2013   x2  . CESAREAN SECTION N/A 07/05/2018   Procedure: REPEAT CESAREAN SECTION;  Surgeon: Nadara MustardHarris, Robert P, MD;  Location: [facility] ORS;  Service: Obstetrics;  Laterality: N/A;  Female born @ [id number] Apgars: 9/9 Weight: 6lbs 2 ozs    Prior to Admission medications   Medication Sig Start Date End Date Taking? Authorizing Provider  amoxicillin (AMOXIL) 500 MG capsule Take 1 capsule (500 mg total) by mouth 3 (three) times daily. 09/06/18   Tommi RumpsSummers, Jennessy Sandridge L, PA-C  ferrous sulfate 325 (65 FE) MG tablet Take 1 tablet (325 mg total) by mouth daily with breakfast. 07/07/18   Farrel ConnersGutierrez, Colleen, CNM  medroxyPROGESTERone (DEPO-PROVERA) 150 MG/ML injection Inject 1 mL (150 mg total) into the muscle every 3 (three) months. 08/15/18   Nadara MustardHarris, Robert P, MD  Prenatal Vit-Fe Fumarate-FA (MULTIVITAMIN-PRENATAL) 27-0.8 MG TABS tablet Take 1 tablet by mouth daily.      [provider]  sertraline (ZOLOFT) 50 MG tablet Take 1 tablet (50 mg total) by mouth daily. 08/15/18   Nadara MustardHarris, Robert P, MD    Allergies Latex  Family History  Problem Relation Age of Onset  . Hypertension Mother   . Diabetes Mother   . Kidney disease Father   . Cancer Maternal Grandmother   . Diabetes Maternal Grandmother   . Hypertension Maternal Grandmother   . Cancer Maternal Grandfather   . Diabetes Maternal Grandfather   . Hypertension Maternal Grandfather     Social History Social History   Tobacco Use  . Smoking status: Former Smoker    Packs/day: 0.50    Types: Cigarettes  . Smokeless tobacco: Never Used  Substance Use Topics  . Alcohol use: No  . Drug use: No    Review of Systems Constitutional: No fever/chills Eyes: No visual changes. ENT: Positive sore throat. Cardiovascular: Denies chest pain. Respiratory: Denies shortness of breath. Gastrointestinal: No abdominal pain.  No nausea, no vomiting.  Musculoskeletal: Negative for back pain. Skin: Positive for chronic skin difficulties bilateral axilla. Neurological: Negative for headaches, focal weakness or numbness. ____________________________________________   PHYSICAL EXAM:  VITAL SIGNS: ED Triage Vitals  Enc Vitals Group     BP 09/06/18 0932 125/77     Pulse Rate 09/06/18 0932 86     Resp 09/06/18 0932 16     Temp 09/06/18 0932 99.1 F (37.3 C)  Temp Source 09/06/18 0932 Oral     SpO2 09/06/18 0932 100 %     Weight 09/06/18 0933 (!) 310 lb (140.6 kg)     Height 09/06/18 0933 5\' 6"  (1.676 m)     Head Circumference --      Peak Flow --      Pain Score 09/06/18 0932 9     Pain Loc --      Pain Edu? --      Excl. in GC? --    Constitutional: Alert and oriented. Well appearing and in no acute distress. Eyes: Conjunctivae are normal.  Head: Atraumatic. Nose: No congestion/rhinnorhea. Mouth/Throat: Mucous membranes are moist.  Oropharynx erythematous with tonsillar  enlargement but no exudate noted. Neck: No stridor.   Hematological/Lymphatic/Immunilogical: No cervical lymphadenopathy. Cardiovascular: Normal rate, regular rhythm. Grossly normal heart sounds.  Good peripheral circulation. Respiratory: Normal respiratory effort.  No retractions. Lungs CTAB. Gastrointestinal: Soft and nontender. No distention.  Musculoskeletal: Moves upper and lower extremities with any difficulty.  Normal gait was noted. Neurologic:  Normal speech and language. No gross focal neurologic deficits are appreciated. No gait instability. Skin:  Skin is warm, dry.  On examination bilateral axilla there is superficial open pores with very minimal clear drainage.  There is no erythema and no cystic formation present.  Large areas of chronic healing hidradenitis present. Psychiatric: Mood and affect are normal. Speech and behavior are normal.  ____________________________________________   LABS (all labs ordered are listed, but only abnormal results are displayed)  Labs Reviewed  GROUP A STREP BY PCR - Abnormal; Notable for the following components:      Result Value   Group A Strep by PCR DETECTED (*)    All other components within normal limits     PROCEDURES  Procedure(s) performed: None  Procedures  Critical Care performed: No  ____________________________________________   INITIAL IMPRESSION / ASSESSMENT AND PLAN / ED COURSE  As part of my medical decision making, I reviewed the following data within the electronic MEDICAL RECORD NUMBER Notes from prior ED visits and [place] Controlled Substance Database  Patient presents to the ED with complaint of sore throat and chronic areas bilateral axilla.  [facility]  She states that her child was treated for strep throat.  On exam today she has chronic adenitis but no erythematous cystic abscesses.  Strep test was positive and patient was made aware.  She was given an  amoxicillin capsule while in the ED and had no difficulties with swallowing it.  She was prescribed enough amoxicillin for 10-day course.  She also is to follow-up with her PCP or surgeon listed on her discharge papers should she decide to have surgery done for her hidradenitis.   ____________________________________________   FINAL CLINICAL IMPRESSION(S) / ED DIAGNOSES  Final diagnoses:  Strep pharyngitis  Hidradenitis axillaris     ED Discharge Orders         Ordered    amoxicillin (AMOXIL) 500 MG capsule  3 times daily     09/06/18 1137           Note:  This document was prepared using Dragon voice recognition software and may include unintentional dictation errors.    Tommi RumpsSummers, Dametra Whetsel L, PA-C 09/06/18 1657    Willy Eddyobinson, Patrick, MD 09/07/18 [phone]

## 2018-09-07 ENCOUNTER — Encounter: Payer: Self-pay | Admitting: *Deleted

## 2018-09-07 ENCOUNTER — Ambulatory Visit (INDEPENDENT_AMBULATORY_CARE_PROVIDER_SITE_OTHER): Payer: Medicaid Other | Admitting: General Surgery

## 2018-09-07 ENCOUNTER — Encounter: Payer: Self-pay | Admitting: General Surgery

## 2018-09-07 ENCOUNTER — Other Ambulatory Visit: Payer: Self-pay

## 2018-09-07 VITALS — BP 101/71 | HR 83 | Temp 97.7°F | Resp 14 | Ht 66.0 in | Wt 319.0 lb

## 2018-09-07 DIAGNOSIS — L732 Hidradenitis suppurativa: Secondary | ICD-10-CM

## 2018-09-07 NOTE — Patient Instructions (Addendum)
The patient is aware to call back for any questions or new concerns.  Schedule before Feb 1   Hidradenitis Suppurativa Hidradenitis suppurativa is a long-term (chronic) skin disease. It is similar to a severe form of acne, but it affects areas of the body where acne would be unusual, especially areas of the body where skin rubs against skin and becomes moist. These include:  Underarms.  Groin.  Genital area.  Buttocks.  Upper thighs.  Breasts. Hidradenitis suppurativa may start out as small lumps or pimples caused by blocked sweat glands or hair follicles. Pimples may develop into deep sores that break open (rupture) and drain pus. Over time, affected areas of skin may thicken and become scarred. This condition is rare and does not spread from person to person (non-contagious). What are the causes? The exact cause of this condition is not known. It may be related to:  Female and female hormones.  An overactive disease-fighting system (immune system). The immune system may over-react to blocked hair follicles or sweat glands and cause swelling and pus-filled sores. What increases the risk? You are more likely to develop this condition if you:  Are female.  Are 30-85 years old.  Have a family history of hidradenitis suppurativa.  Have a personal history of acne.  Are overweight.  Smoke.  Take the medicine lithium. What are the signs or symptoms? The first symptoms are usually painful bumps in the skin, similar to pimples. The condition may get worse over time (progress), or it may only cause mild symptoms. If the disease progresses, symptoms may include:  Skin bumps getting bigger and growing deeper into the skin.  Bumps rupturing and draining pus.  Itchy, infected skin.  Skin getting thicker and scarred.  Tunnels under the skin (fistulas) where pus drains from a bump.  Pain during daily activities, such as pain during walking if your groin area is  affected.  Emotional problems, such as stress or depression. This condition may affect your appearance and your ability or willingness to wear certain clothes or do certain activities. How is this diagnosed? This condition is diagnosed by a health care provider who specializes in skin diseases (dermatologist). You may be diagnosed based on:  Your symptoms and medical history.  A physical exam.  Testing a pus sample for infection.  Blood tests. How is this treated? Your treatment will depend on how severe your symptoms are. The same treatment will not work for everybody with this condition. You may need to try several treatments to find what works best for you. Treatment may include:  Cleaning and bandaging (dressing) your wounds as needed.  Lifestyle changes, such as new skin care routines.  Taking medicines, such as: ? Antibiotics. ? Acne medicines. ? Medicines to reduce the activity of the immune system. ? A diabetes medicine (metformin). ? Birth control pills, for women. ? Steroids to reduce swelling and pain.  Working with a mental health care provider, if you experience emotional distress due to this condition. If you have severe symptoms that do not get better with medicine, you may need surgery. Surgery may involve:  Using a laser to clear the skin and remove hair follicles.  Opening and draining deep sores.  Removing the areas of skin that are diseased and scarred. Follow these instructions at home: Medicines   Take over-the-counter and prescription medicines only as told by your health care provider.  If you were prescribed an antibiotic medicine, take it as told by your health care provider.  Do not stop taking the antibiotic even if your condition improves. Skin care  If you have open wounds, cover them with a clean dressing as told by your health care provider. Keep wounds clean by washing them gently with soap and water when you bathe.  Do not shave the  areas where you get hidradenitis suppurativa.  Do not wear deodorant.  Wear loose-fitting clothes.  Try to avoid getting overheated or sweaty. If you get sweaty or wet, change into clean, dry clothes as soon as you can.  To help relieve pain and itchiness, cover sore areas with a warm, clean washcloth (warm compress) for 5-10 minutes as often as needed.  If told by your health care provider, take a bleach bath twice a week: ? Fill your bathtub halfway with water. ? Pour in  cup of unscented household bleach. ? Soak in the tub for 5-10 minutes. ? Only soak from the neck down. Avoid water on your face and hair. ? Shower to rinse off the bleach from your skin. General instructions  Learn as much as you can about your disease so that you have an active role in your treatment. Work closely with your health care provider to find treatments that work for you.  If you are overweight, work with your health care provider to lose weight as recommended.  Do not use any products that contain nicotine or tobacco, such as cigarettes and e-cigarettes. If you need help quitting, ask your health care provider.  If you struggle with living with this condition, talk with your health care provider or work with a mental health care provider as recommended.  Keep all follow-up visits as told by your health care provider. This is important. Where to find more information  Hidradenitis Suppurativa [employer].: https://www.hs-foundation.org/ Contact a health care provider if you have:  A flare-up of hidradenitis suppurativa.  A fever or chills.  Trouble controlling your symptoms at home.  Trouble doing your daily activities because of your symptoms.  Trouble dealing with emotional problems related to your condition. Summary  Hidradenitis suppurativa is a long-term (chronic) skin disease. It is similar to a severe form of acne, but it affects areas of the body where acne would be  unusual.  The first symptoms are usually painful bumps in the skin, similar to pimples. The condition may get worse over time (progress), or it may only cause mild symptoms.  If you have open wounds, cover them with a clean dressing as told by your health care provider. Keep wounds clean by washing them gently with soap and water when you bathe.  Besides skin care, treatment may include medicines, laser treatment, and surgery. This information is not intended to replace advice given to you by your health care provider. Make sure you discuss any questions you have with your health care provider. Document Released: 03/17/2004 Document Revised: 08/11/2017 Document Reviewed: 08/11/2017 Elsevier Interactive Patient Education  2019 [employer].

## 2018-09-07 NOTE — Progress Notes (Signed)
Patient's surgery to be scheduled for 09-14-18 at [facility] with Dr. Lady Gary.  The patient is aware she will be contacted by the Pre-Admission Testing Department to complete a phone interview sometime in the near future. Patient states she recently had this done so she is aware that if she does not receive a phone call from the Pre-admission Department that she will follow previous instructions that were given.   The patient is aware to call the office should she have further questions.

## 2018-09-07 NOTE — H&P (View-Only) (Signed)
Patient ID: Lisann Kirkpatrick, female   DOB: February 17, 1986, 33 y.o.   MRN: [id number]  Chief Complaint  Patient presents with  . New Patient (Initial Visit)     new pt seen in ER bilateral hidrenitis axillary    HPI Zyonna Stankovic is a 33 y.o. female.   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  She reports that she has had hidradenitis for at least the past 10 years.  It has been predominantly in the bilateral axillae, but she has had it under her breasts in the past.  She no longer has breast involvement.  She has never had groin involvement.  She says that the right side has been draining foul-smelling purulent discharge for about the past 3 weeks.  She went to the emergency department yesterday, due to a sore throat.  While she was there, she mentioned the drainage and was referred to surgery for further evaluation.  Of note, she was diagnosed with strep pharyngitis and is currently on antibiotics.  She endorses having occasional fevers when she has flares, occasional chills, no nausea or vomiting.  She says that the current site used to be one large hole.  She says she treated it with hydrogen peroxide at home, and now there are 3 small holes that are draining.  She is super morbidly obese, but is not a smoker (was prior to recent pregnancy).  She is recently status post cesarean section in November 2019.  She apparently presents to the emergency department on a fairly frequent basis with issues related to her hidradenitis.  Most often, she is seen in the [facility] emergency department.  She has had multiple incisions and drainages performed.   Past Medical History:  Diagnosis Date  . Anemia   . Hidradenitis     Past Surgical History:  Procedure Laterality Date  . CESAREAN SECTION  2006, 2013   x2  . CESAREAN SECTION N/A 07/05/2018   Procedure: REPEAT CESAREAN SECTION;  Surgeon: Nadara Mustard, MD;  Location: [facility] ORS;  Service: Obstetrics;  Laterality: N/A;  Female born @  [birth year] Apgars: 9/9 Weight: 6lbs 2 ozs    Family History  Problem Relation Age of Onset  . Hypertension Mother   . Diabetes Mother   . Kidney disease Father   . Cancer Maternal Grandmother   . Diabetes Maternal Grandmother   . Hypertension Maternal Grandmother   . Pancreatic cancer Maternal Grandmother   . Cancer Maternal Grandfather   . Diabetes Maternal Grandfather   . Hypertension Maternal Grandfather   . Pancreatic cancer Maternal Grandfather   . Colon cancer Neg Hx   . Breast cancer Neg Hx     Social History Social History   Tobacco Use  . Smoking status: Former Smoker    Packs/day: 0.50    Types: Cigarettes    Last attempt to quit: 09/17/2017    Years since quitting: 0.9  . Smokeless tobacco: Never Used  Substance Use Topics  . Alcohol use: No  . Drug use: No    Allergies  Allergen Reactions  . Latex Hives    Current Outpatient Medications  Medication Sig Dispense Refill  . amoxicillin (AMOXIL) 500 MG capsule Take 1 capsule (500 mg total) by mouth 3 (three) times daily. 29 capsule 0  . medroxyPROGESTERone (DEPO-PROVERA) 150 MG/ML injection Inject 1 mL (150 mg total) into the muscle every 3 (three) months. 1 mL 3  . Prenatal Vit-Fe Fumarate-FA (MULTIVITAMIN-PRENATAL) 27-0.8 MG TABS tablet Take 1 tablet by  mouth daily.      No current facility-administered medications for this visit.     Review of Systems Review of Systems  Constitutional: Positive for chills and fever.  HENT: Positive for sore throat.   Eyes: Negative.   Respiratory: Negative.   Cardiovascular: Negative.   Gastrointestinal: Negative.   Genitourinary: Negative.   Musculoskeletal: Negative.   Skin: Positive for wound.       Draining right axillary lesion(s).  Neurological: Negative.   Hematological: Negative.   Psychiatric/Behavioral: Negative.     Blood pressure 101/71, pulse 83, temperature 97.7 F (36.5 C), temperature source Skin, resp. rate 14, height 5\' 6"  (1.676 m), weight (!)  319 lb (144.7 kg), last menstrual period 09/02/2018, SpO2 98 %, not currently breastfeeding.  Physical Exam Physical Exam Constitutional:      General: She is not in acute distress.    Appearance: She is obese. She is not ill-appearing.  HENT:     Head: Normocephalic and atraumatic.     Mouth/Throat:     Mouth: Mucous membranes are moist.     Pharynx: Oropharyngeal exudate and posterior oropharyngeal erythema present.     Comments: Enlarged, erythematous tonsils with white exudate. Eyes:     General: No scleral icterus.       Right eye: No discharge.        Left eye: No discharge.     Pupils: Pupils are equal, round, and reactive to light.  Neck:     Musculoskeletal: No neck rigidity or muscular tenderness.  Cardiovascular:     Rate and Rhythm: Normal rate and regular rhythm.     Pulses: Normal pulses.  Pulmonary:     Effort: Pulmonary effort is normal.     Breath sounds: Normal breath sounds.  Chest:       Comments: Inframammary scars consistent with history of hidradenitis involvement, quiescent currently.  Active disease in both axillae, with sinus tracts and puckered/scarred skin.  Abdominal:     General: Bowel sounds are normal. There is no distension.     Palpations: Abdomen is soft. There is no mass.     Tenderness: There is no abdominal tenderness.  Genitourinary:    Comments: deferred Musculoskeletal: Normal range of motion.        General: No swelling or deformity.  Lymphadenopathy:     Cervical: No cervical adenopathy.  Neurological:     General: No focal deficit present.     Mental Status: She is alert and oriented to person, place, and time.  Psychiatric:        Mood and Affect: Mood normal.        Behavior: Behavior normal.     Data Reviewed: I have reviewed the electronic medical record, including multiple notes from the emergency department here and [facility]. [facility] in June 2017.  She was back in  the emergency department about 6 months later but no procedure was performed.  She was placed on antibiotics.  The next documented ED report from [facility] is from 03 September 2018.  She apparently had an initial intake but then left before any further intervention could take place.  Labs were reviewed, however no CBC was performed at her most recent ER appearance [facility].  She did have a positive strep group A strep by PCR.  Assessment    This is a 33 year old female with a history of hidradenitis supra T. Eva.  She has had  multiple incisions and drainages performed.  She currently has draining lesions in her right axilla.  She is interested in having this addressed.    Plan    We will plan to take her to the operating room for an excision of the multiple sinus tracts versus incision and drainage of the abscess.  She was cautioned that she should not start smoking again as this is a risk factor for hidradenitis.  She was also counseled that obesity can contribute to the disease process as well.  We will arrange for her to meet with anesthesiology prior to her operation, due to her super morbid obesity, so that they can come up with a safe plan for her anesthetic.  Today's visit was over 45 minutes in length.  Greater than 50% of this time was spent in counseling and coordination of patient care.       Duanne Guess 09/07/2018, 4:48 PM

## 2018-09-07 NOTE — Progress Notes (Signed)
Patient ID: Nichole Berry, female   DOB: February 17, 1986, 33 y.o.   MRN: [id number]  Chief Complaint  Patient presents with  . New Patient (Initial Visit)     new pt seen in ER bilateral hidrenitis axillary    HPI Nichole Berry is a 33 y.o. female.   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  She reports that she has had hidradenitis for at least the past 10 years.  It has been predominantly in the bilateral axillae, but she has had it under her breasts in the past.  She no longer has breast involvement.  She has never had groin involvement.  She says that the right side has been draining foul-smelling purulent discharge for about the past 3 weeks.  She went to the emergency department yesterday, due to a sore throat.  While she was there, she mentioned the drainage and was referred to surgery for further evaluation.  Of note, she was diagnosed with strep pharyngitis and is currently on antibiotics.  She endorses having occasional fevers when she has flares, occasional chills, no nausea or vomiting.  She says that the current site used to be one large hole.  She says she treated it with hydrogen peroxide at home, and now there are 3 small holes that are draining.  She is super morbidly obese, but is not a smoker (was prior to recent pregnancy).  She is recently status post cesarean section in November 2019.  She apparently presents to the emergency department on a fairly frequent basis with issues related to her hidradenitis.  Most often, she is seen in the [facility] emergency department.  She has had multiple incisions and drainages performed.   Past Medical History:  Diagnosis Date  . Anemia   . Hidradenitis     Past Surgical History:  Procedure Laterality Date  . CESAREAN SECTION  2006, 2013   x2  . CESAREAN SECTION N/A 07/05/2018   Procedure: REPEAT CESAREAN SECTION;  Surgeon: Nadara Mustard, MD;  Location: [facility] ORS;  Service: Obstetrics;  Laterality: N/A;  Female born @  [birth year] Apgars: 9/9 Weight: 6lbs 2 ozs    Family History  Problem Relation Age of Onset  . Hypertension Mother   . Diabetes Mother   . Kidney disease Father   . Cancer Maternal Grandmother   . Diabetes Maternal Grandmother   . Hypertension Maternal Grandmother   . Pancreatic cancer Maternal Grandmother   . Cancer Maternal Grandfather   . Diabetes Maternal Grandfather   . Hypertension Maternal Grandfather   . Pancreatic cancer Maternal Grandfather   . Colon cancer Neg Hx   . Breast cancer Neg Hx     Social History Social History   Tobacco Use  . Smoking status: Former Smoker    Packs/day: 0.50    Types: Cigarettes    Last attempt to quit: 09/17/2017    Years since quitting: 0.9  . Smokeless tobacco: Never Used  Substance Use Topics  . Alcohol use: No  . Drug use: No    Allergies  Allergen Reactions  . Latex Hives    Current Outpatient Medications  Medication Sig Dispense Refill  . amoxicillin (AMOXIL) 500 MG capsule Take 1 capsule (500 mg total) by mouth 3 (three) times daily. 29 capsule 0  . medroxyPROGESTERone (DEPO-PROVERA) 150 MG/ML injection Inject 1 mL (150 mg total) into the muscle every 3 (three) months. 1 mL 3  . Prenatal Vit-Fe Fumarate-FA (MULTIVITAMIN-PRENATAL) 27-0.8 MG TABS tablet Take 1 tablet by  mouth daily.      No current facility-administered medications for this visit.     Review of Systems Review of Systems  Constitutional: Positive for chills and fever.  HENT: Positive for sore throat.   Eyes: Negative.   Respiratory: Negative.   Cardiovascular: Negative.   Gastrointestinal: Negative.   Genitourinary: Negative.   Musculoskeletal: Negative.   Skin: Positive for wound.       Draining right axillary lesion(s).  Neurological: Negative.   Hematological: Negative.   Psychiatric/Behavioral: Negative.     Blood pressure 101/71, pulse 83, temperature 97.7 F (36.5 C), temperature source Skin, resp. rate 14, height 5' 6" (1.676 m), weight (!)  319 lb (144.7 kg), last menstrual period 09/02/2018, SpO2 98 %, not currently breastfeeding.  Physical Exam Physical Exam Constitutional:      General: She is not in acute distress.    Appearance: She is obese. She is not ill-appearing.  HENT:     Head: Normocephalic and atraumatic.     Mouth/Throat:     Mouth: Mucous membranes are moist.     Pharynx: Oropharyngeal exudate and posterior oropharyngeal erythema present.     Comments: Enlarged, erythematous tonsils with white exudate. Eyes:     General: No scleral icterus.       Right eye: No discharge.        Left eye: No discharge.     Pupils: Pupils are equal, round, and reactive to light.  Neck:     Musculoskeletal: No neck rigidity or muscular tenderness.  Cardiovascular:     Rate and Rhythm: Normal rate and regular rhythm.     Pulses: Normal pulses.  Pulmonary:     Effort: Pulmonary effort is normal.     Breath sounds: Normal breath sounds.  Chest:       Comments: Inframammary scars consistent with history of hidradenitis involvement, quiescent currently.  Active disease in both axillae, with sinus tracts and puckered/scarred skin.  Abdominal:     General: Bowel sounds are normal. There is no distension.     Palpations: Abdomen is soft. There is no mass.     Tenderness: There is no abdominal tenderness.  Genitourinary:    Comments: deferred Musculoskeletal: Normal range of motion.        General: No swelling or deformity.  Lymphadenopathy:     Cervical: No cervical adenopathy.  Neurological:     General: No focal deficit present.     Mental Status: She is alert and oriented to person, place, and time.  Psychiatric:        Mood and Affect: Mood normal.        Behavior: Behavior normal.     Data Reviewed: I have reviewed the electronic medical record, including multiple notes from the emergency department here and at [facility]. She had an incision and drainage at [facility] of the left axilla in June 2017.  She was back in  the emergency department about 6 months later but no procedure was performed.  She was placed on antibiotics.  The next documented ED report from [facility] is from 03 September 2018.  She apparently had an initial intake but then left before any further intervention could take place.  Labs were reviewed, however no CBC was performed at her most recent ER appearance at [facility].  She did have a positive strep group A strep by PCR.  Assessment    This is a 32-year-old female with a history of hidradenitis supra T. Eva.  She has had   multiple incisions and drainages performed.  She currently has draining lesions in her right axilla.  She is interested in having this addressed.    Plan    We will plan to take her to the operating room for an excision of the multiple sinus tracts versus incision and drainage of the abscess.  She was cautioned that she should not start smoking again as this is a risk factor for hidradenitis.  She was also counseled that obesity can contribute to the disease process as well.  We will arrange for her to meet with anesthesiology prior to her operation, due to her super morbid obesity, so that they can come up with a safe plan for her anesthetic.  Today's visit was over 45 minutes in length.  Greater than 50% of this time was spent in counseling and coordination of patient care.       Duanne Guess 09/07/2018, 4:48 PM

## 2018-09-12 ENCOUNTER — Other Ambulatory Visit: Payer: Self-pay

## 2018-09-12 ENCOUNTER — Encounter
Admission: RE | Admit: 2018-09-12 | Discharge: 2018-09-12 | Disposition: A | Discharge status: Home / Self Care | Payer: Medicaid Other | Source: Ambulatory Visit | Attending: General Surgery | Admitting: General Surgery

## 2018-09-12 HISTORY — DX: Streptococcal pharyngitis: J02.0

## 2018-09-12 NOTE — Patient Instructions (Signed)
Your procedure is scheduled on: 09-14-18 Report to Same Day Surgery 2nd floor medical mall [facility] Entrance-take elevator on left to 2nd floor.  Check in with surgery information desk.) To find out your arrival time please call [phone] between 1PM - 3PM on 09-13-18  Remember: Instructions that are not followed completely may result in serious medical risk, up to and including death, or upon the discretion of your surgeon and anesthesiologist your surgery may need to be rescheduled.    _x___ 1. Do not eat food after midnight the night before your procedure. You may drink clear liquids up to 2 hours before you are scheduled to arrive at the hospital for your procedure.  Do not drink clear liquids within 2 hours of your scheduled arrival to the hospital.  Clear liquids include  --Water or Apple juice without pulp  --Clear carbohydrate beverage such as ClearFast or Gatorade  --Black Coffee or Clear Tea (No milk, no creamers, do not add anything to  the coffee or Tea   ____Ensure clear carbohydrate drink on the way to the hospital for bariatric patients  ____Ensure clear carbohydrate drink 3 hours before surgery for Dr Rutherford Nail patients if physician instructed.   No gum chewing or hard candies.     __x__ 2. No Alcohol for 24 hours before or after surgery.   __x__3. No Smoking or e-cigarettes for 24 prior to surgery.  Do not use any chewable tobacco products for at least 6 hour prior to surgery   ____  4. Bring all medications with you on the day of surgery if instructed.    __x__ 5. Notify your doctor if there is any change in your medical condition     (cold, fever, infections).    x___6. On the morning of surgery brush your teeth with toothpaste and water.  You may rinse your mouth with mouth wash if you wish.  Do not swallow any toothpaste or mouthwash.   Do not wear jewelry, make-up, hairpins, clips or nail polish.  Do not wear lotions, powders, or perfumes. You may wear  deodorant.  Do not shave 48 hours prior to surgery. Men may shave face and neck.  Do not bring valuables to the hospital.    [facility] is not responsible for any belongings or valuables.               Contacts, dentures or bridgework may not be worn into surgery.  Leave your suitcase in the car. After surgery it may be brought to your room.  For patients admitted to the hospital, discharge time is determined by your  treatment team.  _  Patients discharged the day of surgery will not be allowed to drive home.  You will need someone to drive you home and stay with you the night of your procedure.    Please read over the following fact sheets that you were given:   [facility] Preparing for Surgery  ____ Take anti-hypertensive listed below, cardiac, seizure, asthma,anti-reflux and psychiatric medicines. These include:  1. NONE  2.  3.  4.  5.  6.  ____Fleets enema or Magnesium Citrate as directed.   _x___ Use CHG Soap or sage wipes as directed on instruction sheet   ____ Use inhalers on the day of surgery and bring to hospital day of surgery  ____ Stop Metformin and Janumet 2 days prior to surgery.    ____ Take 1/2 of usual insulin dose the night before surgery and none on  the morning surgery.   ____ Follow recommendations from Cardiologist, Pulmonologist or PCP regarding stopping Aspirin, Coumadin, Plavix ,Eliquis, Effient, or Pradaxa, and Pletal.  X____Stop Anti-inflammatories such as Advil, Aleve, Ibuprofen, Motrin, Naproxen, Naprosyn, Goodies powders or aspirin products NOW-OK to take Tylenol    ____ Stop supplements until after surgery.     ____ Bring C-Pap to the hospital.

## 2018-09-13 MED ORDER — DEXTROSE 5 % IV SOLN
3.0000 g | INTRAVENOUS | Status: AC
  Administered 2018-09-14: 3 g via INTRAVENOUS
  Filled 2018-09-13: qty 3000

## 2018-09-14 ENCOUNTER — Encounter
Admission: RE | Disposition: A | Discharge status: Home / Self Care | Payer: Self-pay | Source: Home / Self Care | Attending: General Surgery

## 2018-09-14 ENCOUNTER — Observation Stay
Admission: RE | Admit: 2018-09-14 | Discharge: 2018-09-15 | Disposition: A | Discharge status: Home / Self Care | Payer: Medicaid Other | Attending: General Surgery | Admitting: General Surgery

## 2018-09-14 ENCOUNTER — Ambulatory Visit: Payer: Medicaid Other | Admitting: Certified Registered"

## 2018-09-14 ENCOUNTER — Other Ambulatory Visit: Payer: Self-pay

## 2018-09-14 DIAGNOSIS — Z793 Long term (current) use of hormonal contraceptives: Secondary | ICD-10-CM | POA: Diagnosis not present

## 2018-09-14 DIAGNOSIS — Z6841 Body Mass Index (BMI) 40.0 and over, adult: Secondary | ICD-10-CM | POA: Insufficient documentation

## 2018-09-14 DIAGNOSIS — Z87891 Personal history of nicotine dependence: Secondary | ICD-10-CM | POA: Insufficient documentation

## 2018-09-14 DIAGNOSIS — L732 Hidradenitis suppurativa: Secondary | ICD-10-CM | POA: Diagnosis present

## 2018-09-14 HISTORY — PX: HYDRADENITIS EXCISION: SHX5243

## 2018-09-14 LAB — POCT PREGNANCY, URINE: Preg Test, Ur: NEGATIVE

## 2018-09-14 SURGERY — EXCISION, HIDRADENITIS, AXILLA
Anesthesia: General | Laterality: Right

## 2018-09-14 MED ORDER — FAMOTIDINE 20 MG PO TABS
20.0000 mg | ORAL_TABLET | Freq: Once | ORAL | Status: AC
  Administered 2018-09-14: 20 mg via ORAL

## 2018-09-14 MED ORDER — CHLORHEXIDINE GLUCONATE 4 % EX LIQD: 60.0000 mL | Freq: Once | CUTANEOUS | Status: DC

## 2018-09-14 MED ORDER — KETOROLAC TROMETHAMINE 30 MG/ML IJ SOLN
30.0000 mg | Freq: Four times a day (QID) | INTRAMUSCULAR | Status: DC
  Administered 2018-09-14 – 2018-09-15 (×4): 30 mg via INTRAVENOUS
  Filled 2018-09-14 (×4): qty 1

## 2018-09-14 MED ORDER — FENTANYL CITRATE (PF) 100 MCG/2ML IJ SOLN
INTRAMUSCULAR | Status: AC
  Administered 2018-09-14: 50 ug via INTRAVENOUS
  Filled 2018-09-14: qty 2

## 2018-09-14 MED ORDER — FENTANYL CITRATE (PF) 100 MCG/2ML IJ SOLN
INTRAMUSCULAR | Status: AC
  Filled 2018-09-14: qty 2

## 2018-09-14 MED ORDER — MEPERIDINE HCL 50 MG/ML IJ SOLN: 6.2500 mg | INTRAMUSCULAR | Status: DC | PRN

## 2018-09-14 MED ORDER — ONDANSETRON HCL 4 MG/2ML IJ SOLN
INTRAMUSCULAR | Status: DC | PRN
  Administered 2018-09-14: 4 mg via INTRAVENOUS

## 2018-09-14 MED ORDER — LIDOCAINE-EPINEPHRINE 1 %-1:100000 IJ SOLN
INTRAMUSCULAR | Status: AC
  Filled 2018-09-14: qty 1

## 2018-09-14 MED ORDER — CELECOXIB 200 MG PO CAPS
ORAL_CAPSULE | ORAL | Status: AC
  Administered 2018-09-14: 200 mg via ORAL
  Filled 2018-09-14: qty 1

## 2018-09-14 MED ORDER — DEXAMETHASONE SODIUM PHOSPHATE 10 MG/ML IJ SOLN
INTRAMUSCULAR | Status: DC | PRN
  Administered 2018-09-14: 10 mg via INTRAVENOUS

## 2018-09-14 MED ORDER — LACTATED RINGERS IV SOLN
INTRAVENOUS | Status: DC
  Administered 2018-09-14: 10:00:00 via INTRAVENOUS

## 2018-09-14 MED ORDER — PROMETHAZINE HCL 25 MG/ML IJ SOLN: 6.2500 mg | INTRAMUSCULAR | Status: DC | PRN

## 2018-09-14 MED ORDER — HYDROMORPHONE HCL 1 MG/ML IJ SOLN
0.5000 mg | INTRAMUSCULAR | Status: DC | PRN
  Administered 2018-09-14 – 2018-09-15 (×4): 0.5 mg via INTRAVENOUS
  Filled 2018-09-14 (×4): qty 0.5

## 2018-09-14 MED ORDER — DEXAMETHASONE SODIUM PHOSPHATE 10 MG/ML IJ SOLN
INTRAMUSCULAR | Status: AC
  Filled 2018-09-14: qty 1

## 2018-09-14 MED ORDER — ROCURONIUM BROMIDE 50 MG/5ML IV SOLN
INTRAVENOUS | Status: AC
  Filled 2018-09-14: qty 1

## 2018-09-14 MED ORDER — ACETAMINOPHEN 650 MG RE SUPP: 650.0000 mg | Freq: Four times a day (QID) | RECTAL | Status: DC | PRN

## 2018-09-14 MED ORDER — BUPIVACAINE HCL (PF) 0.25 % IJ SOLN
INTRAMUSCULAR | Status: AC
  Filled 2018-09-14: qty 30

## 2018-09-14 MED ORDER — SEVOFLURANE IN SOLN
RESPIRATORY_TRACT | Status: AC
  Filled 2018-09-14: qty 250

## 2018-09-14 MED ORDER — CELECOXIB 200 MG PO CAPS
200.0000 mg | ORAL_CAPSULE | ORAL | Status: AC
  Administered 2018-09-14: 200 mg via ORAL

## 2018-09-14 MED ORDER — FENTANYL CITRATE (PF) 100 MCG/2ML IJ SOLN
25.0000 ug | INTRAMUSCULAR | Status: DC | PRN
  Administered 2018-09-14 (×2): 25 ug via INTRAVENOUS
  Administered 2018-09-14 (×2): 50 ug via INTRAVENOUS

## 2018-09-14 MED ORDER — BUPIVACAINE LIPOSOME 1.3 % IJ SUSP
INTRAMUSCULAR | Status: AC
  Filled 2018-09-14: qty 20

## 2018-09-14 MED ORDER — ONDANSETRON HCL 4 MG/2ML IJ SOLN
INTRAMUSCULAR | Status: AC
  Filled 2018-09-14: qty 2

## 2018-09-14 MED ORDER — AMOXICILLIN-POT CLAVULANATE 875-125 MG PO TABS
1.0000 | ORAL_TABLET | Freq: Two times a day (BID) | ORAL | Status: DC
  Administered 2018-09-14 – 2018-09-15 (×3): 1 via ORAL
  Filled 2018-09-14 (×4): qty 1

## 2018-09-14 MED ORDER — LIDOCAINE HCL (PF) 2 % IJ SOLN
INTRAMUSCULAR | Status: AC
  Filled 2018-09-14: qty 10

## 2018-09-14 MED ORDER — LIDOCAINE-EPINEPHRINE 1 %-1:100000 IJ SOLN
INTRAMUSCULAR | Status: DC | PRN
  Administered 2018-09-14: 10 mL

## 2018-09-14 MED ORDER — FENTANYL CITRATE (PF) 100 MCG/2ML IJ SOLN
INTRAMUSCULAR | Status: DC | PRN
  Administered 2018-09-14 (×2): 25 ug via INTRAVENOUS
  Administered 2018-09-14: 50 ug via INTRAVENOUS
  Administered 2018-09-14 (×2): 25 ug via INTRAVENOUS
  Administered 2018-09-14: 50 ug via INTRAVENOUS

## 2018-09-14 MED ORDER — GABAPENTIN 300 MG PO CAPS
300.0000 mg | ORAL_CAPSULE | ORAL | Status: AC
  Administered 2018-09-14: 300 mg via ORAL

## 2018-09-14 MED ORDER — PHENYLEPHRINE HCL 10 MG/ML IJ SOLN
INTRAMUSCULAR | Status: DC | PRN
  Administered 2018-09-14 (×3): 100 ug via INTRAVENOUS

## 2018-09-14 MED ORDER — SUCCINYLCHOLINE CHLORIDE 20 MG/ML IJ SOLN
INTRAMUSCULAR | Status: AC
  Filled 2018-09-14: qty 1

## 2018-09-14 MED ORDER — MIDAZOLAM HCL 2 MG/2ML IJ SOLN
INTRAMUSCULAR | Status: DC | PRN
  Administered 2018-09-14: 2 mg via INTRAVENOUS

## 2018-09-14 MED ORDER — EPHEDRINE SULFATE 50 MG/ML IJ SOLN
INTRAMUSCULAR | Status: AC
  Filled 2018-09-14: qty 1

## 2018-09-14 MED ORDER — ACETAMINOPHEN 500 MG PO TABS
ORAL_TABLET | ORAL | Status: AC
  Administered 2018-09-14: 1000 mg via ORAL
  Filled 2018-09-14: qty 2

## 2018-09-14 MED ORDER — OXYCODONE HCL 5 MG PO TABS
5.0000 mg | ORAL_TABLET | ORAL | Status: DC | PRN
  Administered 2018-09-14: 5 mg via ORAL
  Administered 2018-09-14 – 2018-09-15 (×4): 10 mg via ORAL
  Filled 2018-09-14: qty 2
  Filled 2018-09-14: qty 1
  Filled 2018-09-14 (×3): qty 2

## 2018-09-14 MED ORDER — LIDOCAINE HCL (CARDIAC) PF 100 MG/5ML IV SOSY
PREFILLED_SYRINGE | INTRAVENOUS | Status: DC | PRN
  Administered 2018-09-14: 100 mg via INTRAVENOUS

## 2018-09-14 MED ORDER — ACETAMINOPHEN 500 MG PO TABS
1000.0000 mg | ORAL_TABLET | ORAL | Status: AC
  Administered 2018-09-14: 1000 mg via ORAL

## 2018-09-14 MED ORDER — MIDAZOLAM HCL 2 MG/2ML IJ SOLN
INTRAMUSCULAR | Status: AC
  Filled 2018-09-14: qty 2

## 2018-09-14 MED ORDER — GLYCOPYRROLATE 0.2 MG/ML IJ SOLN
INTRAMUSCULAR | Status: DC | PRN
  Administered 2018-09-14: 0.2 mg via INTRAVENOUS

## 2018-09-14 MED ORDER — SUCCINYLCHOLINE CHLORIDE 20 MG/ML IJ SOLN
INTRAMUSCULAR | Status: DC | PRN
  Administered 2018-09-14: 140 mg via INTRAVENOUS

## 2018-09-14 MED ORDER — OXYCODONE HCL 5 MG PO TABS: 5.0000 mg | ORAL_TABLET | Freq: Once | ORAL | Status: DC | PRN

## 2018-09-14 MED ORDER — ACETAMINOPHEN 325 MG PO TABS
650.0000 mg | ORAL_TABLET | Freq: Four times a day (QID) | ORAL | Status: DC | PRN
  Administered 2018-09-15: 650 mg via ORAL
  Filled 2018-09-14 (×2): qty 2

## 2018-09-14 MED ORDER — OXYCODONE HCL 5 MG/5ML PO SOLN: 5.0000 mg | Freq: Once | ORAL | Status: DC | PRN

## 2018-09-14 MED ORDER — DAKINS (1/4 STRENGTH) 0.125 % EX SOLN
Freq: Once | CUTANEOUS | Status: AC
  Administered 2018-09-14: 1
  Filled 2018-09-14: qty 473

## 2018-09-14 MED ORDER — PROPOFOL 10 MG/ML IV BOLUS
INTRAVENOUS | Status: AC
  Filled 2018-09-14: qty 20

## 2018-09-14 MED ORDER — GABAPENTIN 300 MG PO CAPS
ORAL_CAPSULE | ORAL | Status: AC
  Administered 2018-09-14: 300 mg via ORAL
  Filled 2018-09-14: qty 1

## 2018-09-14 MED ORDER — LACTATED RINGERS IV SOLN
INTRAVENOUS | Status: DC
  Administered 2018-09-14 (×2): via INTRAVENOUS

## 2018-09-14 MED ORDER — TRAMADOL HCL 50 MG PO TABS
50.0000 mg | ORAL_TABLET | Freq: Four times a day (QID) | ORAL | Status: DC | PRN
  Administered 2018-09-15: 50 mg via ORAL
  Filled 2018-09-14: qty 1

## 2018-09-14 MED ORDER — FAMOTIDINE 20 MG PO TABS
ORAL_TABLET | ORAL | Status: AC
  Administered 2018-09-14: 20 mg via ORAL
  Filled 2018-09-14: qty 1

## 2018-09-14 MED ORDER — PROPOFOL 10 MG/ML IV BOLUS
INTRAVENOUS | Status: DC | PRN
  Administered 2018-09-14: 30 mg via INTRAVENOUS
  Administered 2018-09-14: 200 mg via INTRAVENOUS

## 2018-09-14 MED ORDER — ACETAMINOPHEN 500 MG PO TABS
1000.0000 mg | ORAL_TABLET | Freq: Four times a day (QID) | ORAL | Status: DC
  Administered 2018-09-14 – 2018-09-15 (×4): 1000 mg via ORAL
  Filled 2018-09-14 (×4): qty 2

## 2018-09-14 MED ORDER — EPHEDRINE SULFATE 50 MG/ML IJ SOLN
INTRAMUSCULAR | Status: DC | PRN
  Administered 2018-09-14: 5 mg via INTRAVENOUS

## 2018-09-14 SURGICAL SUPPLY — 25 items
BLADE SURG 15 STRL LF DISP TIS (BLADE) ×1 IMPLANT
BLADE SURG 15 STRL SS (BLADE) ×1
BNDG GAUZE 4.5X4.1 6PLY STRL (MISCELLANEOUS) ×1 IMPLANT
CANISTER SUCT 1200ML W/VALVE (MISCELLANEOUS) ×2 IMPLANT
CHLORAPREP W/TINT 26ML (MISCELLANEOUS) ×2 IMPLANT
COVER WAND RF STERILE (DRAPES) ×2 IMPLANT
DERMABOND ADVANCED (GAUZE/BANDAGES/DRESSINGS) ×1
DERMABOND ADVANCED .7 DNX12 (GAUZE/BANDAGES/DRESSINGS) ×1 IMPLANT
DRAPE LAPAROTOMY TRNSV 106X77 (MISCELLANEOUS) ×2 IMPLANT
GAUZE PACKING IODOFORM 1/2 (PACKING) ×2 IMPLANT
GAUZE SPONGE 4X4 12PLY STRL (GAUZE/BANDAGES/DRESSINGS) ×2 IMPLANT
GLOVE BIO SURGEON STRL SZ 6.5 (GLOVE) ×3 IMPLANT
GLOVE INDICATOR 7.0 STRL GRN (GLOVE) ×3 IMPLANT
GOWN STRL REUS W/ TWL LRG LVL3 (GOWN DISPOSABLE) ×2 IMPLANT
GOWN STRL REUS W/TWL LRG LVL3 (GOWN DISPOSABLE) ×2
LABEL OR SOLS (LABEL) ×2 IMPLANT
NEEDLE HYPO 22GX1.5 SAFETY (NEEDLE) ×2 IMPLANT
NS IRRIG 500ML POUR BTL (IV SOLUTION) ×2 IMPLANT
PACK BASIN MINOR ARMC (MISCELLANEOUS) ×2 IMPLANT
SPONGE LAP 18X18 RF (DISPOSABLE) ×2 IMPLANT
SUT MNCRL 4-0 (SUTURE) ×1
SUT MNCRL 4-0 27XMFL (SUTURE) ×1
SUT VIC AB 2-0 CT2 27 (SUTURE) ×4 IMPLANT
SUTURE MNCRL 4-0 27XMF (SUTURE) ×1 IMPLANT
SYR 10ML LL (SYRINGE) ×4 IMPLANT

## 2018-09-14 NOTE — Anesthesia Procedure Notes (Signed)
Procedure Name: Intubation Date/Time: 09/14/2018 10:30 AM Performed by: Lavone Orn, CRNA Pre-anesthesia Checklist: Patient identified, Emergency Drugs available, Suction available, Patient being monitored and Timeout performed Patient Re-evaluated:Patient Re-evaluated prior to induction Oxygen Delivery Method: Circle system utilized Preoxygenation: Pre-oxygenation with 100% oxygen Induction Type: IV induction Ventilation: Mask ventilation without difficulty Laryngoscope Size: Mac and 4 Grade View: Grade I Tube type: Oral Number of attempts: 1 Airway Equipment and Method: Stylet Placement Confirmation: ETT inserted through vocal cords under direct vision,  positive ETCO2 and breath sounds checked- equal and bilateral Secured at: 23 cm Tube secured with: Tape Dental Injury: Teeth and Oropharynx as per pre-operative assessment

## 2018-09-14 NOTE — Op Note (Signed)
Preoperative diagnosis: Recurrent, infected hidradenitis suprativa  Postoperative diagnosis: Same  Operation: Wide local excision of hidradenitis with sinus tracts.  Total wound area is 8 cm long by 3 cm wide by 1.5 cm deep  Anesthesia: General endotracheal  Surgeon: Duanne Guess, MD  Assistant: None  Findings: Homero Fellers purulent drainage from multiple sinus tracts.  The majority were contained to the superficial skin layer, however one sinus tract extended for approximately 1.5 cm.  It was completely excised along with the overlying skin.  Indications: Nichole Berry is a 33 year old woman who has had hidradenitis for many years.  She has had multiple incision and drainage procedures.  She recently developed recurrent purulent, foul-smelling drainage from her right axilla.  She was seen in clinic.  There appeared to be multiple sinus tracts in a previously incised area.  Wide local excision was recommended.  The risks of the operation were discussed with her and she agreed to proceed.  Procedure in detail: The patient was identified in the preoperative holding area where she was marked.  She was then brought to the operating room and placed supine on the OR table.  Bony prominences were padded and bilateral sequential compression devices were placed on the lower extremities.  General endotracheal anesthesia was induced without incident.  The patient was positioned appropriately for the operation and sterilely prepped and draped in standard fashion.  A timeout was performed confirming the patient's identity, the procedure being performed, her allergies, all necessary equipment was available, and that maintenance anesthesia was adequate.  A perioperative dose of antibiotics was administered by the anesthesia team.  An elliptical incision was made overlying the area of induration and purulent drainage.  This was carried down through the dermis using electrocautery.  The overlying dermis and  accompanying sinus tracts were excised along with a small amount of fat.  There was a large sinus tract with frank pus coming from it.  This was dissected down to the base and completely excised.  All tissue was handed off as specimen.  The wound was copiously irrigated with normal saline solution.  It was then packed with Kerlix moistened with Dakin solution.  An ABD was applied over the wound.  The patient was awakened, extubated, and taken to the postanesthesia care unit in good condition.  Estimated blood loss: Less than 5 cc  IV fluids: Please see anesthesia record  Specimen: Right axillary hidradenitis and sinus tract  Complications: None immediately apparent  Disposition: Stable to the postanesthesia care unit with subsequent overnight observation.

## 2018-09-14 NOTE — Transfer of Care (Signed)
Immediate Anesthesia Transfer of Care Note  Patient: Nichole Berry  Procedure(s) Performed: EXCISION HIDRADENITIS AXILLA - RIGHT (Right )  Patient Location: PACU  Anesthesia Type:General  Level of Consciousness: awake and drowsy  Airway & Oxygen Therapy: Patient Spontanous Breathing and Patient connected to face mask oxygen  Post-op Assessment: Report given to RN and Post -op Vital signs reviewed and stable  Post vital signs: stable  Last Vitals:  Vitals Value Taken Time  BP 127/80 09/14/2018 11:34 AM  Temp 36.6 C 09/14/2018 11:36 AM  Pulse 84 09/14/2018 11:38 AM  Resp 20 09/14/2018 11:38 AM  SpO2 100 % 09/14/2018 11:38 AM  Vitals shown include unvalidated device data.  Last Pain:  Vitals:   09/14/18 1136  TempSrc:   PainSc: Asleep         Complications: No apparent anesthesia complications

## 2018-09-14 NOTE — Interval H&P Note (Signed)
History and Physical Interval Note:  09/14/2018 9:41 AM  Nichole Berry  has presented today for surgery, with the diagnosis of HIDRADENITIS SUPPURATIVA  The various methods of treatment have been discussed with the patient and family. After consideration of risks, benefits and other options for treatment, the patient has consented to  Procedure(s): EXCISION HIDRADENITIS AXILLA - RIGHT (Right) as a surgical intervention .  The patient's history has been reviewed, patient examined, no change in status, stable for surgery.  I have reviewed the patient's chart and labs.  Questions were answered to the patient's satisfaction.     Duanne Guess

## 2018-09-14 NOTE — Anesthesia Post-op Follow-up Note (Signed)
Anesthesia QCDR form completed.        

## 2018-09-14 NOTE — Anesthesia Postprocedure Evaluation (Signed)
Anesthesia Post Note  Patient: Nichole Berry  Procedure(s) Performed: EXCISION HIDRADENITIS AXILLA - RIGHT (Right )  Patient location during evaluation: PACU Anesthesia Type: General Level of consciousness: awake and alert and oriented Pain management: pain level controlled Vital Signs Assessment: post-procedure vital signs reviewed and stable Respiratory status: spontaneous breathing, nonlabored ventilation and respiratory function stable Cardiovascular status: blood pressure returned to baseline and stable Postop Assessment: no signs of nausea or vomiting Anesthetic complications: no     Last Vitals:  Vitals:   09/14/18 1204 09/14/18 1219  BP: 123/85 105/71  Pulse: 63 66  Resp: 14 18  Temp:    SpO2: 100% 100%    Last Pain:  Vitals:   09/14/18 1219  TempSrc:   PainSc: 2                  Numa Heatwole

## 2018-09-14 NOTE — Anesthesia Preprocedure Evaluation (Signed)
Anesthesia Evaluation  Patient identified by MRN, date of birth, ID band Patient awake    Reviewed: Allergy & Precautions, NPO status , Patient's Chart, lab work & pertinent test results  History of Anesthesia Complications Negative for: history of anesthetic complications  Airway Mallampati: I  TM Distance: >3 FB Neck ROM: Full    Dental no notable dental hx.    Pulmonary neg sleep apnea, neg COPD, former smoker,    breath sounds clear to auscultation- rhonchi (-) wheezing      Cardiovascular (-) hypertension(-) CAD, (-) Past MI, (-) Cardiac Stents and (-) CABG  Rhythm:Regular Rate:Normal - Systolic murmurs and - Diastolic murmurs    Neuro/Psych negative neurological ROS  negative psych ROS   GI/Hepatic negative GI ROS, Neg liver ROS,   Endo/Other  negative endocrine ROSneg diabetes  Renal/GU negative Renal ROS     Musculoskeletal negative musculoskeletal ROS (+)   Abdominal (+) + obese,   Peds  Hematology  (+) anemia ,   Anesthesia Other Findings Past Medical History: No date: Anemia   Reproductive/Obstetrics                             Anesthesia Physical Anesthesia Plan  ASA: II  Anesthesia Plan: General   Post-op Pain Management:    Induction: Intravenous  PONV Risk Score and Plan: 2 and Ondansetron, Dexamethasone and Midazolam  Airway Management Planned: Oral ETT  Additional Equipment:   Intra-op Plan:   Post-operative Plan: Extubation in OR  Informed Consent: I have reviewed the patients History and Physical, chart, labs and discussed the procedure including the risks, benefits and alternatives for the proposed anesthesia with the patient or authorized representative who has indicated his/her understanding and acceptance.     Dental advisory given  Plan Discussed with: CRNA and Anesthesiologist  Anesthesia Plan Comments:         Anesthesia Quick  Evaluation

## 2018-09-15 DIAGNOSIS — L732 Hidradenitis suppurativa: Secondary | ICD-10-CM | POA: Diagnosis not present

## 2018-09-15 MED ORDER — DAKINS (1/4 STRENGTH) 0.125 % EX SOLN
Freq: Two times a day (BID) | CUTANEOUS | Status: DC
  Administered 2018-09-15: 10:00:00
  Filled 2018-09-15: qty 473

## 2018-09-15 MED ORDER — AMOXICILLIN-POT CLAVULANATE 875-125 MG PO TABS: 1.0000 | ORAL_TABLET | Freq: Two times a day (BID) | ORAL | 0 refills | Status: AC

## 2018-09-15 MED ORDER — OXYCODONE HCL 5 MG PO TABS: 5.0000 mg | ORAL_TABLET | Freq: Four times a day (QID) | ORAL | 0 refills | Status: DC | PRN

## 2018-09-15 NOTE — Progress Notes (Signed)
Dressing changed as permd's order. Pt tol well, Instructed patient on dressing change, verbalized understanding.

## 2018-09-15 NOTE — Discharge Summary (Addendum)
[facility] SURGICAL DISCHARGE SUMMARY  Patient ID: Nichole Berry MRN: [id number] DOB/AGE: 32-22-1987 32 y.o.  Admit date: 09/14/2018 Discharge date: 09/15/2018  Discharge Diagnoses Hidradenitis suppurativa of right axilla  Consultants None  Procedures -- 09/14/2018:  Wide local excision of hidradenitis with sinus tracts.   HPI: Nichole Berry is a 33 y.o. female with a history of suppurative hidradenitis of the right axilla who is following with Dr Lady Gary. She presents to [facility] on 09/14/2018 for surgical management of this condition.   Hospital Course: Informed consent was obtained and documented, and patient underwent uneventful wide local excision of hidradenitis with sinus tracts.  There was frank pus encountered in the operating room. (Dr Lady Gary, 09/15/2018).  Post-operatively, patient's pain improved/resolved and advancement of patient's diet and ambulation were well-tolerated. The remainder of patient's hospital course was essentially unremarkable, and discharge planning was initiated accordingly with patient safely able to be discharged home with appropriate discharge instructions, antibiotics, pain control, and outpatient follow-up after all of her questions were answered to her expressed satisfaction.  Discharge Condition: Good   Physical Examination:  Constitutional: Well appearing female, NAD Pulmonary: Normal effort, no respiratory distress Skin: Dressing to right axilla   Allergies as of 09/15/2018      Reactions   Latex Hives      Medication List    TAKE these medications     amoxicillin-clavulanate 875-125 MG tablet Commonly known as:  AUGMENTIN Take 1 tablet by mouth 2 (two) times daily for 7 days.   medroxyPROGESTERone 150 MG/ML injection Commonly known as:  DEPO-PROVERA Inject 1 mL (150 mg total) into the muscle every 3 (three) months.   oxyCODONE 5 MG immediate release tablet Commonly known as:  Oxy IR/ROXICODONE Take 1 tablet (5  mg total) by mouth every 6 (six) hours as needed.   prenatal multivitamin Tabs tablet Take 1 tablet by mouth daily at 12 noon.            Discharge Care Instructions  (From admission, onward)         Start     Ordered   09/15/18 0000  Change dressing (specify)    Comments:  Dressing change: 2 times per day as instructed.   09/15/18 0836           Follow-up Information    [facility]. Schedule an appointment as soon as possible for a visit in 1 week(s).   Specialty:  General Surgery Why:  1 week appointment with one of Dr Katina Dung partners S/P Wide local excision of hidradenitis with sinus tract on 01/29 Contact information: [address] [place] [address] [phone]           -- Lynden Oxford , PA-C [facility]  09/15/2018, 8:36 AM [phone] M-F: 7am - 4pm  I saw and evaluated the patient.  I agree with the above documentation, exam, and plan, which I have edited where appropriate. Duanne Guess  1:57 PM

## 2018-09-16 LAB — SURGICAL PATHOLOGY

## 2018-09-19 ENCOUNTER — Encounter: Payer: Self-pay | Admitting: Surgery

## 2018-09-19 MED ORDER — OXYCODONE HCL 5 MG PO TABS: 5.0000 mg | ORAL_TABLET | Freq: Four times a day (QID) | ORAL | 0 refills | Status: DC | PRN

## 2018-09-20 ENCOUNTER — Telehealth: Payer: Self-pay | Admitting: *Deleted

## 2018-09-20 NOTE — Telephone Encounter (Signed)
See Dr Earlene Plater notation and discussion with pt.

## 2018-09-22 ENCOUNTER — Encounter: Payer: Self-pay | Admitting: Surgery

## 2018-09-22 ENCOUNTER — Encounter: Payer: Medicaid Other | Admitting: Surgery

## 2018-09-27 ENCOUNTER — Encounter: Payer: Medicaid Other | Admitting: Surgery

## 2019-11-14 ENCOUNTER — Ambulatory Visit: Payer: Medicaid Other | Admitting: Obstetrics & Gynecology

## 2020-08-17 HISTORY — DX: Maternal care for unspecified type scar from previous cesarean delivery: O34.219

## 2021-05-07 ENCOUNTER — Encounter: Payer: Self-pay | Admitting: General Surgery

## 2021-07-31 DIAGNOSIS — O34219 Maternal care for unspecified type scar from previous cesarean delivery: Secondary | ICD-10-CM | POA: Insufficient documentation

## 2021-07-31 DIAGNOSIS — Z3A39 39 weeks gestation of pregnancy: Secondary | ICD-10-CM | POA: Insufficient documentation

## 2021-09-09 ENCOUNTER — Other Ambulatory Visit: Payer: Self-pay

## 2021-09-09 ENCOUNTER — Emergency Department: Payer: Medicaid - Out of State

## 2021-09-09 ENCOUNTER — Emergency Department: Payer: Medicaid - Out of State | Admitting: Anesthesiology

## 2021-09-09 ENCOUNTER — Encounter
Admission: EM | Disposition: A | Discharge status: Home / Self Care | Payer: Self-pay | Source: Home / Self Care | Attending: Student in an Organized Health Care Education/Training Program

## 2021-09-09 ENCOUNTER — Encounter: Payer: Self-pay | Admitting: Emergency Medicine

## 2021-09-09 ENCOUNTER — Observation Stay
Admission: EM | Admit: 2021-09-09 | Discharge: 2021-09-09 | Disposition: A | Discharge status: Home / Self Care | Payer: Medicaid - Out of State | Attending: Student in an Organized Health Care Education/Training Program | Admitting: Student in an Organized Health Care Education/Training Program

## 2021-09-09 DIAGNOSIS — Z87891 Personal history of nicotine dependence: Secondary | ICD-10-CM | POA: Diagnosis not present

## 2021-09-09 DIAGNOSIS — Z91040 Latex allergy status: Secondary | ICD-10-CM | POA: Diagnosis not present

## 2021-09-09 DIAGNOSIS — Z20822 Contact with and (suspected) exposure to covid-19: Secondary | ICD-10-CM | POA: Diagnosis not present

## 2021-09-09 DIAGNOSIS — N939 Abnormal uterine and vaginal bleeding, unspecified: Secondary | ICD-10-CM

## 2021-09-09 HISTORY — PX: DILATATION & CURETTAGE/HYSTEROSCOPY WITH MYOSURE: SHX6511

## 2021-09-09 LAB — BASIC METABOLIC PANEL
Anion gap: 7 (ref 5–15)
BUN: 15 mg/dL (ref 6–20)
CO2: 24 mmol/L (ref 22–32)
Calcium: 8.4 mg/dL — ABNORMAL LOW (ref 8.9–10.3)
Chloride: 109 mmol/L (ref 98–111)
Creatinine, Ser: 0.87 mg/dL (ref 0.44–1.00)
GFR, Estimated: >60 mL/min (ref >60)
Glucose, Bld: 96 mg/dL (ref 70–99)
Potassium: 3.9 mmol/L (ref 3.5–5.1)
Sodium: 140 mmol/L (ref 135–145)

## 2021-09-09 LAB — CBC WITH DIFFERENTIAL/PLATELET
Abs Immature Granulocytes: 0.04 10*3/uL (ref 0.00–0.07)
Abs Immature Granulocytes: 0.02 10*3/uL (ref 0.00–0.07)
Basophils Absolute: 0 10*3/uL (ref 0.0–0.1)
Basophils Absolute: 0 10*3/uL (ref 0.0–0.1)
Basophils Relative: 0 %
Basophils Relative: 0 %
Eosinophils Absolute: 0.2 10*3/uL (ref 0.0–0.5)
Eosinophils Absolute: 0.2 10*3/uL (ref 0.0–0.5)
Eosinophils Relative: 3 %
Eosinophils Relative: 3 %
HCT: 31.7 % — ABNORMAL LOW (ref 36.0–46.0)
HCT: 28.7 % — ABNORMAL LOW (ref 36.0–46.0)
Hemoglobin: 9.7 g/dL — ABNORMAL LOW (ref 12.0–15.0)
Hemoglobin: 9.1 g/dL — ABNORMAL LOW (ref 12.0–15.0)
Immature Granulocytes: 1 %
Immature Granulocytes: 0 %
Lymphocytes Relative: 35 %
Lymphocytes Relative: 40 %
Lymphs Abs: 2.9 10*3/uL (ref 0.7–4.0)
Lymphs Abs: 3.2 10*3/uL (ref 0.7–4.0)
MCH: 22.9 pg — ABNORMAL LOW (ref 26.0–34.0)
MCH: 23.5 pg — ABNORMAL LOW (ref 26.0–34.0)
MCHC: 30.6 g/dL (ref 30.0–36.0)
MCHC: 31.7 g/dL (ref 30.0–36.0)
MCV: 74.8 fL — ABNORMAL LOW (ref 80.0–100.0)
MCV: 74 fL — ABNORMAL LOW (ref 80.0–100.0)
Monocytes Absolute: 0.6 10*3/uL (ref 0.1–1.0)
Monocytes Absolute: 0.6 10*3/uL (ref 0.1–1.0)
Monocytes Relative: 7 %
Monocytes Relative: 7 %
Neutro Abs: 4.5 10*3/uL (ref 1.7–7.7)
Neutro Abs: 3.9 10*3/uL (ref 1.7–7.7)
Neutrophils Relative %: 54 %
Neutrophils Relative %: 50 %
Platelets: 330 10*3/uL (ref 150–400)
Platelets: 319 10*3/uL (ref 150–400)
RBC: 4.24 MIL/uL (ref 3.87–5.11)
RBC: 3.88 MIL/uL (ref 3.87–5.11)
RDW: 16.3 % — ABNORMAL HIGH (ref 11.5–15.5)
RDW: 16.2 % — ABNORMAL HIGH (ref 11.5–15.5)
WBC: 8.2 10*3/uL (ref 4.0–10.5)
WBC: 8 10*3/uL (ref 4.0–10.5)
nRBC: 0 % (ref 0.0–0.2)
nRBC: 0 % (ref 0.0–0.2)

## 2021-09-09 LAB — RESP PANEL BY RT-PCR (FLU A&B, COVID) ARPGX2
Influenza A by PCR: NEGATIVE
Influenza B by PCR: NEGATIVE
SARS Coronavirus 2 by RT PCR: NEGATIVE

## 2021-09-09 LAB — HCG, QUANTITATIVE, PREGNANCY: hCG, Beta Chain, Quant, S: <1 m[IU]/mL (ref <5)

## 2021-09-09 SURGERY — DILATATION & CURETTAGE/HYSTEROSCOPY WITH MYOSURE
Anesthesia: General

## 2021-09-09 MED ORDER — IBUPROFEN 800 MG PO TABS: 800.0000 mg | ORAL_TABLET | Freq: Three times a day (TID) | ORAL | Status: DC | PRN

## 2021-09-09 MED ORDER — CEFAZOLIN IN SODIUM CHLORIDE 3-0.9 GM/100ML-% IV SOLN
3.0000 g | INTRAVENOUS | Status: AC
  Administered 2021-09-09: 13:00:00 3 g via INTRAVENOUS
  Filled 2021-09-09: qty 100

## 2021-09-09 MED ORDER — LIDOCAINE-EPINEPHRINE 1 %-1:100000 IJ SOLN
INTRAMUSCULAR | Status: AC
  Filled 2021-09-09: qty 1

## 2021-09-09 MED ORDER — DEXAMETHASONE SODIUM PHOSPHATE 10 MG/ML IJ SOLN
INTRAMUSCULAR | Status: AC
  Filled 2021-09-09: qty 1

## 2021-09-09 MED ORDER — ACETAMINOPHEN 10 MG/ML IV SOLN
1000.0000 mg | Freq: Once | INTRAVENOUS | Status: DC | PRN
  Administered 2021-09-09: 16:00:00 1000 mg via INTRAVENOUS

## 2021-09-09 MED ORDER — PROPOFOL 10 MG/ML IV BOLUS
INTRAVENOUS | Status: AC
  Filled 2021-09-09: qty 40

## 2021-09-09 MED ORDER — PHENYLEPHRINE HCL (PRESSORS) 10 MG/ML IV SOLN
INTRAVENOUS | Status: AC
  Filled 2021-09-09: qty 1

## 2021-09-09 MED ORDER — TRANEXAMIC ACID 1000 MG/10ML IV SOLN
INTRAVENOUS | Status: DC | PRN
  Administered 2021-09-09: 14:00:00 1000 mg via INTRAVENOUS

## 2021-09-09 MED ORDER — TRANEXAMIC ACID 650 MG PO TABS: 1300.0000 mg | ORAL_TABLET | Freq: Three times a day (TID) | ORAL | 0 refills | Status: AC

## 2021-09-09 MED ORDER — ONDANSETRON HCL 4 MG/2ML IJ SOLN: 4.0000 mg | Freq: Four times a day (QID) | INTRAMUSCULAR | Status: DC | PRN

## 2021-09-09 MED ORDER — OXYCODONE HCL 5 MG/5ML PO SOLN: 5.0000 mg | Freq: Once | ORAL | Status: DC | PRN

## 2021-09-09 MED ORDER — FENTANYL CITRATE (PF) 100 MCG/2ML IJ SOLN
INTRAMUSCULAR | Status: AC
  Filled 2021-09-09: qty 2

## 2021-09-09 MED ORDER — OXYCODONE HCL 5 MG PO TABS: 5.0000 mg | ORAL_TABLET | Freq: Once | ORAL | Status: DC | PRN

## 2021-09-09 MED ORDER — ACETAMINOPHEN 10 MG/ML IV SOLN
INTRAVENOUS | Status: AC
  Filled 2021-09-09: qty 100

## 2021-09-09 MED ORDER — LACTATED RINGERS IV SOLN: INTRAVENOUS | Status: DC

## 2021-09-09 MED ORDER — ONDANSETRON HCL 4 MG/2ML IJ SOLN
INTRAMUSCULAR | Status: DC | PRN
  Administered 2021-09-09: 4 mg via INTRAVENOUS

## 2021-09-09 MED ORDER — SUCCINYLCHOLINE CHLORIDE 200 MG/10ML IV SOSY
PREFILLED_SYRINGE | INTRAVENOUS | Status: AC
  Filled 2021-09-09: qty 10

## 2021-09-09 MED ORDER — ROCURONIUM BROMIDE 10 MG/ML (PF) SYRINGE
PREFILLED_SYRINGE | INTRAVENOUS | Status: AC
  Filled 2021-09-09: qty 10

## 2021-09-09 MED ORDER — IBUPROFEN 800 MG PO TABS: 800.0000 mg | ORAL_TABLET | Freq: Three times a day (TID) | ORAL | 0 refills | Status: DC | PRN

## 2021-09-09 MED ORDER — PHENYLEPHRINE HCL-NACL 20-0.9 MG/250ML-% IV SOLN
INTRAVENOUS | Status: DC | PRN
  Administered 2021-09-09: 50 ug/min via INTRAVENOUS

## 2021-09-09 MED ORDER — EPHEDRINE SULFATE (PRESSORS) 50 MG/ML IJ SOLN
INTRAMUSCULAR | Status: DC | PRN
  Administered 2021-09-09: 10 mg via INTRAVENOUS

## 2021-09-09 MED ORDER — LACTATED RINGERS IV SOLN: INTRAVENOUS | Status: DC | PRN

## 2021-09-09 MED ORDER — SILVER NITRATE-POT NITRATE 75-25 % EX MISC
CUTANEOUS | Status: AC
  Filled 2021-09-09: qty 20

## 2021-09-09 MED ORDER — TRANEXAMIC ACID 1000 MG/10ML IV SOLN
INTRAVENOUS | Status: AC
  Filled 2021-09-09: qty 10

## 2021-09-09 MED ORDER — MENTHOL 3 MG MT LOZG
1.0000 | LOZENGE | OROMUCOSAL | Status: DC | PRN
  Filled 2021-09-09: qty 9

## 2021-09-09 MED ORDER — SIMETHICONE 80 MG PO CHEW: 80.0000 mg | CHEWABLE_TABLET | Freq: Four times a day (QID) | ORAL | Status: DC | PRN

## 2021-09-09 MED ORDER — LIDOCAINE-EPINEPHRINE 1 %-1:100000 IJ SOLN
INTRAMUSCULAR | Status: DC | PRN
Start: 2021-09-09 — End: 2021-09-09
  Administered 2021-09-09: 20 mL

## 2021-09-09 MED ORDER — HYDROMORPHONE HCL 1 MG/ML IJ SOLN
0.5000 mg | INTRAMUSCULAR | Status: AC | PRN
  Administered 2021-09-09: 16:00:00 0.5 mg via INTRAVENOUS

## 2021-09-09 MED ORDER — ALUM & MAG HYDROXIDE-SIMETH 200-200-20 MG/5ML PO SUSP: 30.0000 mL | ORAL | Status: DC | PRN

## 2021-09-09 MED ORDER — DEXAMETHASONE SODIUM PHOSPHATE 10 MG/ML IJ SOLN
INTRAMUSCULAR | Status: DC | PRN
  Administered 2021-09-09: 10 mg via INTRAVENOUS

## 2021-09-09 MED ORDER — MIDAZOLAM HCL 2 MG/2ML IJ SOLN
INTRAMUSCULAR | Status: AC
  Filled 2021-09-09: qty 2

## 2021-09-09 MED ORDER — MIDAZOLAM HCL 2 MG/2ML IJ SOLN
INTRAMUSCULAR | Status: DC | PRN
  Administered 2021-09-09 (×2): 2 mg via INTRAVENOUS

## 2021-09-09 MED ORDER — ROCURONIUM BROMIDE 100 MG/10ML IV SOLN
INTRAVENOUS | Status: DC | PRN
  Administered 2021-09-09: 20 mg via INTRAVENOUS

## 2021-09-09 MED ORDER — SUCCINYLCHOLINE CHLORIDE 200 MG/10ML IV SOSY
PREFILLED_SYRINGE | INTRAVENOUS | Status: DC | PRN
  Administered 2021-09-09: 170 mg via INTRAVENOUS

## 2021-09-09 MED ORDER — SODIUM CHLORIDE 0.9 % IR SOLN
Status: DC | PRN
  Administered 2021-09-09: 1150 mL

## 2021-09-09 MED ORDER — DOCUSATE SODIUM 100 MG PO CAPS: 100.0000 mg | ORAL_CAPSULE | Freq: Two times a day (BID) | ORAL | Status: DC

## 2021-09-09 MED ORDER — FENTANYL CITRATE (PF) 100 MCG/2ML IJ SOLN
INTRAMUSCULAR | Status: AC
  Administered 2021-09-09: 15:00:00 25 ug via INTRAVENOUS
  Filled 2021-09-09: qty 2

## 2021-09-09 MED ORDER — PROPOFOL 10 MG/ML IV BOLUS
INTRAVENOUS | Status: DC | PRN
  Administered 2021-09-09: 250 mg via INTRAVENOUS

## 2021-09-09 MED ORDER — POVIDONE-IODINE 10 % EX SWAB: 2.0000 "application " | Freq: Once | CUTANEOUS | Status: DC

## 2021-09-09 MED ORDER — ACETAMINOPHEN 500 MG PO TABS: 1000.0000 mg | ORAL_TABLET | ORAL | Status: DC

## 2021-09-09 MED ORDER — FENTANYL CITRATE (PF) 100 MCG/2ML IJ SOLN
INTRAMUSCULAR | Status: AC
  Administered 2021-09-09: 16:00:00 25 ug via INTRAVENOUS
  Filled 2021-09-09: qty 2

## 2021-09-09 MED ORDER — FENTANYL CITRATE (PF) 100 MCG/2ML IJ SOLN
25.0000 ug | INTRAMUSCULAR | Status: AC | PRN
  Administered 2021-09-09 (×4): 25 ug via INTRAVENOUS

## 2021-09-09 MED ORDER — 0.9 % SODIUM CHLORIDE (POUR BTL) OPTIME
TOPICAL | Status: DC | PRN
  Administered 2021-09-09: 14:00:00 100 mL

## 2021-09-09 MED ORDER — SUGAMMADEX SODIUM 500 MG/5ML IV SOLN
INTRAVENOUS | Status: DC | PRN
  Administered 2021-09-09: 200 mg via INTRAVENOUS

## 2021-09-09 MED ORDER — ONDANSETRON HCL 4 MG/2ML IJ SOLN
INTRAMUSCULAR | Status: AC
  Filled 2021-09-09: qty 2

## 2021-09-09 MED ORDER — OXYCODONE-ACETAMINOPHEN 5-325 MG PO TABS: 1.0000 | ORAL_TABLET | Freq: Four times a day (QID) | ORAL | Status: DC | PRN

## 2021-09-09 MED ORDER — LIDOCAINE HCL (CARDIAC) PF 100 MG/5ML IV SOSY
PREFILLED_SYRINGE | INTRAVENOUS | Status: DC | PRN
  Administered 2021-09-09: 100 mg via INTRAVENOUS

## 2021-09-09 MED ORDER — ONDANSETRON HCL 4 MG/2ML IJ SOLN: 4.0000 mg | Freq: Once | INTRAMUSCULAR | Status: DC | PRN

## 2021-09-09 MED ORDER — HYDROMORPHONE HCL 1 MG/ML IJ SOLN
INTRAMUSCULAR | Status: AC
  Administered 2021-09-09: 16:00:00 0.5 mg via INTRAVENOUS
  Filled 2021-09-09: qty 1

## 2021-09-09 MED ORDER — ONDANSETRON HCL 4 MG PO TABS: 4.0000 mg | ORAL_TABLET | Freq: Four times a day (QID) | ORAL | Status: DC | PRN

## 2021-09-09 MED ORDER — DOCUSATE SODIUM 100 MG PO CAPS: 100.0000 mg | ORAL_CAPSULE | Freq: Two times a day (BID) | ORAL | 0 refills | Status: DC

## 2021-09-09 MED ORDER — PHENYLEPHRINE HCL (PRESSORS) 10 MG/ML IV SOLN
INTRAVENOUS | Status: DC | PRN
  Administered 2021-09-09 (×2): 200 ug via INTRAVENOUS

## 2021-09-09 SURGICAL SUPPLY — 25 items
BALLN POSTPARTUM SOS BAKRI (BALLOONS)
BALLOON POSTPARTUM SOS BAKRI (BALLOONS) IMPLANT
CATH FOLEY 2WAY SIL 16X30 (CATHETERS) ×1 IMPLANT
CUP MEDICINE 2OZ PLAST GRAD ST (MISCELLANEOUS) ×1 IMPLANT
DEVICE MYOSURE REACH (MISCELLANEOUS) ×1 IMPLANT
DRSG TELFA 3X8 NADH (GAUZE/BANDAGES/DRESSINGS) ×2 IMPLANT
GAUZE 4X4 16PLY ~~LOC~~+RFID DBL (SPONGE) ×2 IMPLANT
GLOVE SURG ENC MOIS LTX SZ7 (GLOVE) ×2 IMPLANT
GLOVE SURG UNDER LTX SZ7.5 (GLOVE) ×2 IMPLANT
GOWN STRL REUS W/ TWL LRG LVL3 (GOWN DISPOSABLE) ×2 IMPLANT
GOWN STRL REUS W/TWL LRG LVL3 (GOWN DISPOSABLE) ×1
HANDLE YANKAUER SUCT BULB TIP (MISCELLANEOUS) ×1 IMPLANT
IV NS IRRIG 3000ML ARTHROMATIC (IV SOLUTION) ×2 IMPLANT
KIT PROCEDURE FLUENT (KITS) ×2 IMPLANT
KIT TURNOVER CYSTO (KITS) ×2 IMPLANT
NDL SPNL 22GX3.5 QUINCKE BK (NEEDLE) IMPLANT
NEEDLE SPNL 22GX3.5 QUINCKE BK (NEEDLE) ×2 IMPLANT
PACK DNC HYST (MISCELLANEOUS) ×2 IMPLANT
PAD DRESSING TELFA 3X8 NADH (GAUZE/BANDAGES/DRESSINGS) IMPLANT
PAD OB MATERNITY 4.3X12.25 (PERSONAL CARE ITEMS) ×2 IMPLANT
PAD PREP 24X41 OB/GYN DISP (PERSONAL CARE ITEMS) ×2 IMPLANT
SCRUB EXIDINE 4% CHG 4OZ (MISCELLANEOUS) ×2 IMPLANT
SEAL ROD LENS SCOPE MYOSURE (ABLATOR) ×1 IMPLANT
SYR 10ML LL (SYRINGE) ×1 IMPLANT
WATER STERILE IRR 500ML POUR (IV SOLUTION) ×2 IMPLANT

## 2021-09-09 NOTE — Anesthesia Preprocedure Evaluation (Addendum)
Anesthesia Evaluation  Patient identified by MRN, date of birth, ID band Patient awake    Reviewed: Allergy & Precautions, NPO status , Patient's Chart, lab work & pertinent test results  History of Anesthesia Complications Negative for: history of anesthetic complications  Airway Mallampati: I   Neck ROM: Full    Dental  (+)    Pulmonary former smoker (quit 2019),    Pulmonary exam normal breath sounds clear to auscultation       Cardiovascular Exercise Tolerance: Good negative cardio ROS Normal cardiovascular exam Rhythm:Regular Rate:Normal     Neuro/Psych negative neurological ROS     GI/Hepatic negative GI ROS,   Endo/Other  Class 3 obesity  Renal/GU negative Renal ROS     Musculoskeletal   Abdominal   Peds  Hematology  (+) Blood dyscrasia, anemia ,   Anesthesia Other Findings   Reproductive/Obstetrics Vaginal bleeding s/p c-section 07/29/21                            Anesthesia Physical Anesthesia Plan  ASA: 3 and emergent  Anesthesia Plan: General   Post-op Pain Management:    Induction: Intravenous  PONV Risk Score and Plan: 3 and Ondansetron, Dexamethasone and Treatment may vary due to age or medical condition  Airway Management Planned: Oral ETT  Additional Equipment:   Intra-op Plan:   Post-operative Plan: Extubation in OR  Informed Consent: I have reviewed the patients History and Physical, chart, labs and discussed the procedure including the risks, benefits and alternatives for the proposed anesthesia with the patient or authorized representative who has indicated his/her understanding and acceptance.     Dental advisory given  Plan Discussed with: CRNA  Anesthesia Plan Comments: (Patient consented for risks of anesthesia including but not limited to:  - adverse reactions to medications - damage to eyes, teeth, lips or other oral mucosa - nerve damage  due to positioning  - sore throat or hoarseness - damage to heart, brain, nerves, lungs, other parts of body or loss of life  Informed patient about role of CRNA in peri- and intra-operative care.  Patient voiced understanding.)        Anesthesia Quick Evaluation

## 2021-09-09 NOTE — H&P (Addendum)
Consult History and Physical   SERVICE: Gynecology   Patient Name: Nichole Berry Patient MRN:   [id number]  CC: vaginal bleeding 6 weeks post cesarean  HPI: Nichole Berry is a 36 y.o. [id number] with persistent heavy vaginal bleeding her c-section 6 weeks ago 07/29/21.Patient states that she has been soaking a pad an hour for at least 2 weeks, but heavier last night around 1 am this morning when she was bleeding through her clothes 30 minutes after changing her pad. Patient denies cramping at this time but endorse abdominal soreness around her umbilicus. This is her fourth C-Section. Patient reports feeling tired and dizzy at times. Denies SOB, chest pain, or fever.    Review of Systems: positives in bold GEN:   fevers, chills, weight changes, appetite changes, fatigue, night sweats HEENT:  HA, vision changes, hearing loss, congestion, rhinorrhea, sinus pressure, dysphagia CV:   CP, palpitations PULM:  SOB, cough GI:  abd pain, N/V/D/C GU:  dysuria, urgency, frequency MSK:  arthralgias, myalgias, back pain, swelling SKIN:  rashes, color changes, pallor NEURO:  numbness, weakness, tingling, seizures, dizziness, tremors PSYCH:  depression, anxiety, behavioral problems, confusion  HEME/LYMPH:  easy bruising or bleeding ENDO:  heat/cold intolerance  Past Obstetrical History: OB History     Gravida  3   Para  3   Term  3   Preterm      AB  0   Living  3      SAB      IAB      Ectopic      Multiple  0   Live Births  3        Obstetric Comments  1st Menstrual Cycle:  13  1st Pregnancy:  17          Past Gynecologic History: Patient's last menstrual period was 10/29/2020 (approximate). Vaginal bleeding persistent since C-Section on 07/29/21. She reports soaking a pad an hour for the last 2 weeks, with the exception of early this morning around 1 am when preparing a bottle she begin soaking a pad every 30 mins and soiling her clothing.  Past Medical  History: Past Medical History:  Diagnosis Date   Anemia    Hidradenitis    Strep throat    FINISHED ANTIBIOTICS ON 09-12-18    Past Surgical History:   Past Surgical History:  Procedure Laterality Date   CESAREAN SECTION  2006, 2013   x2   CESAREAN SECTION N/A 07/05/2018   Procedure: REPEAT CESAREAN SECTION;  Surgeon: Nadara Mustard, MD;  Location: [facility] ORS;  Service: Obstetrics;  Laterality: N/A;  Female born @ 43 Apgars: 9/9 Weight: 6lbs 2 ozs   HYDRADENITIS EXCISION Right 09/14/2018   Procedure: EXCISION HIDRADENITIS AXILLA - RIGHT;  Surgeon: Duanne Guess, MD;  Location: [facility] ORS;  Service: General;  Laterality: Right;    Family History:  family history includes Cancer in her maternal grandfather and maternal grandmother; Diabetes in her maternal grandfather, maternal grandmother, and mother; Hypertension in her maternal grandfather, maternal grandmother, and mother; Kidney disease in her father; Pancreatic cancer in her maternal grandfather and maternal grandmother.  Social History:  Social History   Socioeconomic History   Marital status: Married    Spouse name: Warden/ranger   Number of children: Not on file   Years of education: Not on file   Highest education level: Not on file  Occupational History   Occupation: Unemployed  Tobacco Use   Smoking status: Former    Packs/day: 0.50  Years: 14.00    Pack years: 7.00    Types: Cigarettes    Quit date: 09/17/2017    Years since quitting: 3.9   Smokeless tobacco: Never  Vaping Use   Vaping Use: Never used  Substance and Sexual Activity   Alcohol use: No   Drug use: No   Sexual activity: Not Currently    Birth control/protection: Injection  Other Topics Concern   Not on file  Social History Narrative   Not on file   Social Determinants of Health   Financial Resource Strain: Not on file  Food Insecurity: Not on file  Transportation Needs: Not on file  Physical Activity: Not on file  Stress: Not on file   Social Connections: Not on file  Intimate Partner Violence: Not on file    Home Medications:  Medications reconciled in EPIC  No current facility-administered medications on file prior to encounter.   Current Outpatient Medications on File Prior to Encounter  Medication Sig Dispense Refill   acetaminophen (TYLENOL) 500 MG tablet Take 500 mg by mouth every 6 (six) hours as needed for moderate pain, fever, headache or mild pain.     amoxicillin (AMOXIL) 500 MG capsule Take 1 capsule (500 mg total) by mouth 3 (three) times daily. (Patient not taking: Reported on 09/12/2018) 29 capsule 0   medroxyPROGESTERone (DEPO-PROVERA) 150 MG/ML injection Inject 1 mL (150 mg total) into the muscle every 3 (three) months. (Patient not taking: Reported on 09/09/2021) 1 mL 3   oxyCODONE (OXY IR/ROXICODONE) 5 MG immediate release tablet Take 1 tablet (5 mg total) by mouth every 6 (six) hours as needed for severe pain (dressing changes, pain not relieved by tylenol/NSAIDs). (Patient not taking: Reported on 09/09/2021) 15 tablet 0   Prenatal Vit-Fe Fumarate-FA (PRENATAL MULTIVITAMIN) TABS tablet Take 1 tablet by mouth daily at 12 noon. (Patient not taking: Reported on 09/09/2021)      Allergies:  Allergies  Allergen Reactions   Latex Hives    Physical Exam:  Temp:  [98.4 F (36.9 C)] 98.4 F (36.9 C) (01/24 0422) Pulse Rate:  [65-98] 66 (01/24 1145) Resp:  [17-20] 18 (01/24 1000) BP: (106-135)/(53-82) 108/53 (01/24 1115) SpO2:  [100 %] 100 % (01/24 1145) Weight:  [157.4 kg] 157.4 kg (01/24 0416)   Physical Exam Constitutional:      Appearance: Normal appearance. She is obese.  Genitourinary:     Bladder and urethral meatus normal.     Vaginal bleeding present.  HENT:     Head: Normocephalic.  Cardiovascular:     Rate and Rhythm: Normal rate and regular rhythm.     Pulses: Normal pulses.  Pulmonary:     Effort: Pulmonary effort is normal.  Abdominal:     General: A surgical scar is present.      Palpations: Abdomen is soft.     Comments: Fluid appears to be leaking from surgical site  Musculoskeletal:        General: Normal range of motion.     Cervical back: Normal range of motion and neck supple.  Neurological:     Mental Status: She is alert and oriented to person, place, and time.  Skin:    General: Skin is warm and dry.  Psychiatric:        Mood and Affect: Mood normal.        Behavior: Behavior normal.        Thought Content: Thought content normal.        Judgment: Judgment normal.  Labs/Studies:   CBC and Coags:  Lab Results  Component Value Date   WBC 8.2 09/09/2021   NEUTOPHILPCT 54 09/09/2021   EOSPCT 3 09/09/2021   BASOPCT 0 09/09/2021   LYMPHOPCT 35 09/09/2021   HGB 9.7 (L) 09/09/2021   HCT 31.7 (L) 09/09/2021   MCV 74.8 (L) 09/09/2021   PLT 330 09/09/2021   CMP:  Lab Results  Component Value Date   NA 140 09/09/2021   K 3.9 09/09/2021   CL 109 09/09/2021   CO2 24 09/09/2021   BUN 15 09/09/2021   CREATININE 0.87 09/09/2021   Other Labs: Beta HCG-neg CBC- hgb 9.7, hct 31.7. platelets 330   Other Imaging: US PELVIC COMPLETE WITH TRANSVAGINAL  Result Date: 09/09/2021 CLINICAL DATA:  36 year old female with history of persistent vaginal bleeding following C-section 6 weeks ago. Evaluate for retained products of conception. EXAM: TRANSABDOMINAL AND TRANSVAGINAL ULTRASOUND OF PELVIS DOPPLER ULTRASOUND OF OVARIES TECHNIQUE: Both transabdominal and transvaginal ultrasound examinations of the pelvis were performed. Transabdominal technique was performed for global imaging of the pelvis including uterus, ovaries, adnexal regions, and pelvic cul-de-sac. It was necessary to proceed with endovaginal exam following the transabdominal exam to visualize the ovaries. Color and duplex Doppler ultrasound was utilized to evaluate blood flow to the ovaries. COMPARISON:  No priors. FINDINGS: Uterus Measurements: 12.4 x 6.5 x 6.7 cm = volume: 280 mL mL. Large mass  of heterogeneous echogenicity centered in the body of the uterus, potentially arising from the endometrial canal, measuring approximately 5.7 x 4.3 cm. There is a paucity of internal flow within this lesion on color Doppler imaging. Endometrium Obscured by the mass discussed above. Right ovary Could not be visualized. Left ovary Measurements: 5.4 x 2.5 x 4.4 cm = volume: 30.9 mL. Small follicles. Normal appearance/no adnexal mass. Pulsed Doppler evaluation of both ovaries demonstrates normal low-resistance arterial and venous waveforms. Other findings Small volume of free fluid in the cul-de-sac. There is a small collection of hypoechoic material anterior to the uterus beneath the C-section scar measuring approximately 9.1 x 1.0 x 4.1 cm, likely a postoperative seroma. IMPRESSION: 1. Large mass-like area in the body of the uterus potentially arising from the endometrial canal. Given the patient's history and symptoms, retained products of conception can not be completely excluded. This appears essentially avascular on today's examination, however, some cases of retained products of conception can appear relatively avascular. Alternatively, this could represent a large fibroid. Further clinical evaluation is recommended. 2. Limited study which was unable to visualize the right ovary. 3. Normal appearance of the left ovary. 4. Small volume of free fluid in the cul-de-sac. 5. Small collection of fluid beneath the C-section scar, likely a small postoperative seroma. This does not appear to exert mass effect upon adjacent structures, suggesting against an abscess. Electronically Signed   By: Trudie Reedaniel  Entrikin M.D.   On: 09/09/2021 07:31     Assessment / Plan:   Tonette LedererMARTINA Markwell is a 36 y.o. [id number] who presents with persistent vaginal bleeding after C- Section, and drainage from surgical incision  1. Plan for D&C, Hysteroscopy with myosure resection of presumed retained products of conception. I will also evaluate  the fascia and seroma under anesthesia. R/B/A of surgery discussed. Possibility of fibroid requiring resection, or of bleeding requiring transfusion or hysterectomy discussed. If this were an AVM, we would not proceed to D&C, but vascularity on ultrasound was minimal.  Thank you for the opportunity to be involved with this patient's care.  ----- Christeen DouglasBethany Quadir Muns,  MD MPH [facility], Department of OB/GYN [facility]

## 2021-09-09 NOTE — Discharge Summary (Addendum)
Postpartum Day  6 weeks  Subjective: no complaints, up ad lib, and voiding  Doing well, no concerns. Ambulating without difficulty, pain managed with PO meds, tolerating regular diet, and voiding without difficulty.   No fever/chills, chest pain, shortness of breath, nausea/vomiting, or leg pain. No nipple or breast pain. No headache, visual changes, or RUQ/epigastric pain.  Objective: BP 140/84 (BP Location: Left Wrist) Comment: Patient walking around room, getting dressed for d/c prior to BP assessment   Pulse 81    Temp 98.1 F (36.7 C) (Oral)    Resp 20    Ht 5\' 6"  (1.676 m)    Wt (!) 157.4 kg    LMP 10/29/2020 (Approximate)    SpO2 99%    BMI 56.01 kg/m    Physical Exam:  General: alert, cooperative, and appears stated age Breasts: soft/nontender CV: RRR Pulm: nl effort, CTABL Abdomen: soft, non-tender, active bowel sounds Uterine Fundus: firm   No results for input(s): HGB, HCT, WBC, PLT in the last 72 hours.  Assessment/Plan: 36 y.o. G3P3003 6 weeks postpartum,taken to OR for retained products of conception. Foley then placed by Dr 20 for tamponade. Patient taken to labor and delivery for observation and monitored for continued bleeding.  Foley removed by Dr. Dalbert Garnet with 50 cc of blood collected in foley bag.no active bleeding noted at this time.      Disposition: Patient discharged home in stable condition with bleeding precautions. Patient to f/u in 2 weeks with Dr. Dalbert Garnet    ----- Dalbert Garnet Certified Nurse Midwife [facility]

## 2021-09-09 NOTE — ED Provider Notes (Signed)
[facility] Provider Note    Event Date/Time   First MD Initiated Contact with Patient 09/09/21 0423     (approximate)   History   Vaginal Bleeding   HPI  Nichole Berry is a 36 y.o. female who presents for evaluation of vaginal bleeding.  Patient reports that she is 6 weeks postpartum from a C-section.  She has been bleeding persistently since the C-section.  Patient reports going through 1 pad an hour for the last 6 weeks but tonight her bleeding became even more severe which prompted visit to the emergency room.  She reports lower abdominal cramping associated with it.  No history of bleeding disorder but she does have a history of anemia.  She does not take any blood thinners.  This is patient's fourth C-section.  No syncope, dizziness, chest pain or shortness of breath.  No fever or chills     Past Medical History:  Diagnosis Date   Anemia    Hidradenitis    Strep throat    FINISHED ANTIBIOTICS ON 09-12-18    Past Surgical History:  Procedure Laterality Date   CESAREAN SECTION  2006, 2013   x2   CESAREAN SECTION N/A 07/05/2018   Procedure: REPEAT CESAREAN SECTION;  Surgeon: Nadara Mustard, MD;  Location: [facility] ORS;  Service: Obstetrics;  Laterality: N/A;  Female born @ [birth year] Apgars: 9/9 Weight: 6lbs 2 ozs   HYDRADENITIS EXCISION Right 09/14/2018   Procedure: EXCISION HIDRADENITIS AXILLA - RIGHT;  Surgeon: Duanne Guess, MD;  Location: [facility] ORS;  Service: General;  Laterality: Right;     Physical Exam   Triage Vital Signs: ED Triage Vitals  Enc Vitals Group     BP 09/09/21 0422 121/76     Pulse Rate 09/09/21 0422 93     Resp 09/09/21 0422 17     Temp 09/09/21 0422 98.4 F (36.9 C)     Temp Source 09/09/21 0422 Oral     SpO2 09/09/21 0422 100 %     Weight 09/09/21 0416 (!) 347 lb (157.4 kg)     Height 09/09/21 0416 5\' 6"  (1.676 m)     Head Circumference --      Peak Flow --      Pain Score 09/09/21 0416 9     Pain Loc --      Pain  Edu? --      Excl. in GC? --     Most recent vital signs: Vitals:   09/09/21 0422 09/09/21 0445  BP: 121/76 108/78  Pulse: 93 86  Resp: 17   Temp: 98.4 F (36.9 C)   SpO2: 100% 100%     Constitutional: Alert and oriented. Well appearing and in no apparent distress. HEENT:      Head: Normocephalic and atraumatic.         Eyes: Conjunctivae are normal. Sclera is non-icteric.       Mouth/Throat: Mucous membranes are moist.       Neck: Supple with no signs of meningismus. Cardiovascular: Regular rate and rhythm. No murmurs, gallops, or rubs. 2+ symmetrical distal pulses are present in all extremities.  Respiratory: Normal respiratory effort. Lungs are clear to auscultation bilaterally.  Gastrointestinal: Soft, non tender, and non distended with positive bowel sounds. No rebound or guarding. Pelvic exam: Normal external genitalia, no rashes or lesions.  Os closed with moderate amount of blood in the vaginal vault, no clots. No trauma. No cervical motion tenderness.  No uterine or adnexal tenderness.  Musculoskeletal:  No edema, cyanosis, or erythema of extremities. Neurologic: Normal speech and language. Face is symmetric. Moving all extremities. No gross focal neurologic deficits are appreciated. Skin: Skin is warm, dry and intact. No rash noted. Psychiatric: Mood and affect are normal. Speech and behavior are normal.  ED Results / Procedures / Treatments   Labs (all labs ordered are listed, but only abnormal results are displayed) Labs Reviewed  CBC WITH DIFFERENTIAL/PLATELET - Abnormal; Notable for the following components:      Result Value   Hemoglobin 9.7 (*)    HCT 31.7 (*)    MCV 74.8 (*)    MCH 22.9 (*)    RDW 16.3 (*)    All other components within normal limits  BASIC METABOLIC PANEL  HCG, QUANTITATIVE, PREGNANCY  TYPE AND SCREEN     EKG  none   RADIOLOGY I, Nita Sickle, attending MD, have personally viewed and interpreted the images obtained  during this visit as below:  TVUS PND   ___________________________________________________ Interpretation by Radiologist:  No results found.    PROCEDURES:  Critical Care performed: No  Procedures    IMPRESSION / MDM / ASSESSMENT AND PLAN / ED COURSE  I reviewed the triage vital signs and the nursing notes.  36 y.o. female who presents for evaluation of persistent vaginal bleeding x 6 weeks post c-section.  Patient is well-appearing in no distress, hemodynamically stable.  C-section scar is well-healed.  Pelvic exam showing moderate amount of blood in the vaginal vault with a closed os, no clots, no uterine tenderness or CMT  Ddx: Retained products of conception, menses   Plan: CBC, BMP, hCG, transvaginal ultrasound.  Patient placed on telemetry for monitoring   MEDICATIONS GIVEN IN ED: Medications - No data to display   ED COURSE: Labs showing stable anemia with a hemoglobin of 9.7, no leukocytosis.  No significant electrolyte derangement signs of AKI or dehydration.  hCG negative.  Transvaginal ultrasound is pending.  Care transferred to Dr. Roxan Hockey   Consults: none   EMR reviewed patient's last admission to the hospital in December for her C-section    FINAL CLINICAL IMPRESSION(S) / ED DIAGNOSES   Final diagnoses:  Vaginal bleeding     Rx / DC Orders   ED Discharge Orders     None        Note:  This document was prepared using Dragon voice recognition software and may include unintentional dictation errors.   Please note:  Patient was evaluated in Emergency Department today for the symptoms described in the history of present illness. Patient was evaluated in the context of the global COVID-19 pandemic, which necessitated consideration that the patient might be at risk for infection with the SARS-CoV-2 virus that causes COVID-19. Institutional protocols and algorithms that pertain to the evaluation of patients at risk for COVID-19 are in a state of  rapid change based on information released by regulatory bodies including the CDC and federal and state organizations. These policies and algorithms were followed during the patient's care in the ED.  Some ED evaluations and interventions may be delayed as a result of limited staffing during the pandemic.       Don Perking, [place], MD 09/18/21 [phone]

## 2021-09-09 NOTE — ED Notes (Signed)
Pt updated on plan. Pt told if d&c to be completed around 12:30 as told by previous RN during handoff that pt will need to remove any jewelry/metal and personal clothing. Pt in gown. Pt confirms no doc has stopped by yet to discuss procedure with her. Will complete consent process with pt once provider has discussed procedure with her. Pt denies any significant pain or nausea. Pt resting calmly on stretcher; in NAD. Given another warm blanket. Lights adjusted at pt's request.

## 2021-09-09 NOTE — ED Provider Notes (Signed)
Discussed the results of ultrasound with the patient.  I consulted OB/GYN, Dr. Dalbert Garnet.  Patient remains hemodynamically stable.  OB will come evaluate patient for possible D&C.  Patient agreeable to plan.   Willy Eddy, MD 09/09/21 [phone]

## 2021-09-09 NOTE — Transfer of Care (Signed)
Immediate Anesthesia Transfer of Care Note  Patient: Nichole Berry  Procedure(s) Performed: DILATATION & CURETTAGE/HYSTEROSCOPY WITH MYOSURE AND PLACEMENT OF UTERINE TAMPONADE BALLOON  Patient Location: PACU  Anesthesia Type:General  Level of Consciousness: sedated  Airway & Oxygen Therapy: Patient Spontanous Breathing  Post-op Assessment: Report given to RN  Post vital signs: Reviewed and stable  Last Vitals:  Vitals Value Taken Time  BP 132/69 09/09/21 1434  Temp 36.6 C 09/09/21 1434  Pulse 92 09/09/21 1442  Resp 21 09/09/21 1442  SpO2 100 % 09/09/21 1442  Vitals shown include unvalidated device data.  Last Pain:  Vitals:   09/09/21 1252  TempSrc: Oral  PainSc: 0-No pain         Complications: No notable events documented.

## 2021-09-09 NOTE — ED Notes (Signed)
EDP at bedside  

## 2021-09-09 NOTE — OB Triage Note (Signed)
Patient given discharge instructions. Patient verbalizes understanding of instructions. Patient discharged home in good condition in wheelchair to front of medical mall entrance to be picked up by family member.

## 2021-09-09 NOTE — ED Triage Notes (Signed)
Patient ambulatory to triage with steady gait, without difficulty or distress noted; pt reports c-section 6wks ago in [place]; persistent vag bleeding and mid lower abd pain; just moved here and has had no f/u appt since surg

## 2021-09-09 NOTE — ED Notes (Signed)
Ultrasound in progress at this time.

## 2021-09-09 NOTE — Discharge Instructions (Signed)
Discharge instructions after a hysteroscopy with dilation and curettage ? ?Signs and Symptoms to Report ? ?Call our office at [phone] if you have any of the following:  ? ? Fever over 100.4 degrees or higher ? Severe stomach pain not relieved with pain medications ? Bright red bleeding that?s heavier than a period that does not slow with rest after the first 24 hours ? To go the bathroom a lot (frequency), you can?t hold your urine (urgency), or it hurts when you empty your bladder (urinate) ? Chest pain ? Shortness of breath ? Pain in the calves of your legs ? Severe nausea and vomiting not relieved with anti-nausea medications ? Any concerns ? ?What You Can Expect after Surgery ? You may see some pink tinged, bloody fluid. This is normal. You may also have cramping for several days.  ? ?Activities after Your Discharge ?Follow these guidelines to help speed your recovery at home: ? Don?t drive if you are in pain or taking narcotic pain medicine. You may drive when you can safely slam on the brakes, turn the wheel forcefully, and rotate your torso comfortably. This is typically 4-7 days. Practice in a parking lot or side street prior to attempting to drive regularly.  ? Ask others to help with household chores for 4 weeks. ? Don?t do strenuous activities, exercises, or sports like vacuuming, tennis, squash, etc. until your doctor says it is safe to do so. ? Walk as you feel able. Rest often since it may take a week or two for your energy level to return to normal.  ? You may climb stairs ? Avoid constipation: ?  -Eat fruits, vegetables, and whole grains. Eat small meals as your appetite will take time to return to normal. ?  -Drink 6 to 8 glasses of water each day unless your doctor has told you to limit your fluids. ?  -Use a laxative or stool softener as needed if constipation becomes a problem. You may take Miralax, metamucil, Citrucil, Colace, Senekot, FiberCon, etc. If this does not relieve the  constipation, try two tablespoons of Milk Of Magnesia every 8 hours until your bowels move.  ? You may shower.  ? Do not get in a hot tub, swimming pool, etc. until your doctor agrees. ? Do not douche, use tampons, or have sex until your doctor says it is okay, usually about 2 weeks. ? Take your pain medicine when you need it. The medicine may not work as well if the pain is bad. ? ?Take the medicines you were taking before surgery. Other medications you might need are pain medications (ibuprofen), medications for constipation (Colace) and nausea medications (Zofran).  ? ?  ?

## 2021-09-09 NOTE — Op Note (Signed)
Operative Report Hysteroscopy with Dilation and Curettage; Myosure resection of Endometrial contents   Indications: Heavy uterine bleeding 6 weeks after cesarean section  Pre-operative Diagnosis: Retained products of conception  Post-operative Diagnosis: Possibly the same.  Procedure: 1. Exam under anesthesia 2. D&C 3. Hysteroscopy 4. Myosure endometrial lesion resection  5. Placement of endometrial balloon  Surgeon: Cline Cools, MD  Assistant(s):  None  Anesthesia: General LMA anesthesia  Anesthesiologist: Corinda Gubler, MD Anesthesiologist: Corinda Gubler, MD CRNA: Philbert Riser, CRNA  Estimated Blood Loss:   estimated         Intraoperative medications:  1g IV TXA         Total IV Fluids:  Urine Output: 73ml         Specimens: Myosure endometrial curettings         Complications:  None; patient tolerated the procedure well.         Disposition: PACU - hemodynamically stable.         Condition: stable  Findings: Uterus measuring 8 cm by sound; normal cervix, vagina, perineum. Round, smooth, pale mass with smooth edges and a wide base taking up about 50% of the mass located in the right sided lower uterus, just past the cervix.  Both fallopian tube ostia were visualized.  Bleeding appeared to be at the front of this mass closest to the cervix.  No large vessels were encountered.  Indication for procedure/Consents: 36 y.o. [id number]  here for emergent surgery for the aforementioned diagnoses.    Patient had a cesarean section 6 weeks ago, with a significantly difficult surgery due to adhesions per her op note at that time.  She started bleeding heavily several weeks ago, and has increased to more than 1 pad an hour for the last several days.  Overnight, she started bleeding through her pad in less than 30 minutes.   Ultrasound was significant for a 5 to 6 cm mass in her endometrium, without significant blood flow in the center of the mass.  A diagnosis of  retained products of conception was made, and we came to the operating room for resection using the MyoSure device.    Risks of surgery were discussed with the patient including but not limited to: bleeding which may require transfusion; infection which may require antibiotics; injury to uterus or surrounding organs; intrauterine scarring which may impair future fertility; need for additional procedures including laparotomy or laparoscopy; and other postoperative/anesthesia complications. Written informed consent was obtained.    Procedure Details:   The patient was taken to the operating room where anesthesia was administered and was found to be adequate.  Because she has been bleeding for so long, I did give her 3 g of Ancef, expecting cervical dilation.  After a formal and adequate timeout was performed, she was placed in the dorsal lithotomy position and examined with the above findings. She was then prepped and draped in the sterile manner.   Her bladder was catheterized for an estimated amount of clear, yellow urine. A weighed speculum was then placed in the patient's vagina and a single tooth tenaculum was applied to the anterior lip of the cervix.  20 cc of 1% lidocaine with epi was injected at the 4 and 8 o'clock position on the cervix to decrease blood loss.  Her vaginal vault did have significant blood in it that was coming from the external os.  Her cervix was serially dilated to 15 [place] using Hanks dilators. The hysteroscope was introduced to  reveal the above findings.The hysteroscope was also used to determine the level of the internal os. The uterine cavity was carefully examined, both ostia were recognized, and diffusely proliferative endometrium was noted.  The large, smooth, round mass was evaluated from all angles, and found not to be adherent to the C-section scar nor to any external structures outside the uterus.  Therefore, we made the decision to proceed with MyoSure resection.  The  tissue appeared to be spongy and not firm like a fibroid, although the external appearance of it was most consistent with a submucosal fibroid.  There was not significant bleeding from the middle of the tissue.  I was able to resect 80 to 90% of this mass, before reaching 1000 mL deficit.  The decision was made to stop at this time, and significant bleeding was still noted on the hysteroscopy.  However, this bleeding slowed after several minutes of watching the external os.  She was placed in reverse Trendelenburg to release any blood that was collecting at the fundus.  Some bleeding was still noted, and 20 cc of normal saline was placed into a Foley balloon in her cavity, to provide tamponade.  We did not give her Toradol, but did give her 1 g of TXA during the procedure.  I will monitor the bleeding in the Foley balloon in her uterus, and expect to remove the Foley this evening.  If her bleeding continues, the next step would be a hysterectomy, which I would prefer not to do in our regional hospital overnight.  Repeat of her hemoglobin at the start of surgery found 9.1 down from 9.7 on admission to the ER.  I will repeat this in 6 hours.

## 2021-09-09 NOTE — Anesthesia Procedure Notes (Signed)
Procedure Name: Intubation Date/Time: 09/09/2021 1:16 PM Performed by: Philbert Riser, CRNA Pre-anesthesia Checklist: Patient identified, Patient being monitored, Timeout performed, Emergency Drugs available and Suction available Patient Re-evaluated:Patient Re-evaluated prior to induction Oxygen Delivery Method: Circle system utilized Preoxygenation: Pre-oxygenation with 100% oxygen Induction Type: IV induction Ventilation: Mask ventilation without difficulty Laryngoscope Size: 3 and McGraph Grade View: Grade I Tube type: Oral Tube size: 7.0 mm Number of attempts: 1 Airway Equipment and Method: Stylet Placement Confirmation: ETT inserted through vocal cords under direct vision, positive ETCO2 and breath sounds checked- equal and bilateral Secured at: 21 cm Tube secured with: Tape Dental Injury: Teeth and Oropharynx as per pre-operative assessment

## 2021-09-10 ENCOUNTER — Encounter: Payer: Self-pay | Admitting: Obstetrics and Gynecology

## 2021-09-10 LAB — TYPE AND SCREEN
ABO/RH(D): O POS
Antibody Screen: POSITIVE
Unit division: 0
Unit division: 0

## 2021-09-10 LAB — BPAM RBC
Blood Product Expiration Date: 202303012359
Blood Product Expiration Date: 202303012359
Unit Type and Rh: 5100
Unit Type and Rh: 5100

## 2021-09-10 NOTE — Anesthesia Postprocedure Evaluation (Signed)
Anesthesia Post Note  Patient: Nichole Berry  Procedure(s) Performed: DILATATION & CURETTAGE/HYSTEROSCOPY WITH MYOSURE AND PLACEMENT OF UTERINE TAMPONADE BALLOON  Patient location during evaluation: PACU Anesthesia Type: General Level of consciousness: awake and alert Pain management: pain level controlled Vital Signs Assessment: post-procedure vital signs reviewed and stable Respiratory status: spontaneous breathing, nonlabored ventilation, respiratory function stable and patient connected to nasal cannula oxygen Cardiovascular status: blood pressure returned to baseline and stable Postop Assessment: no apparent nausea or vomiting Anesthetic complications: no   No notable events documented.   Last Vitals:  Vitals:   09/09/21 1703 09/09/21 1807  BP: 109/66 140/84  Pulse: 77 81  Resp: 18 20  Temp: 36.8 C 36.7 C  SpO2:      Last Pain:  Vitals:   09/09/21 1807  TempSrc: Oral  PainSc:                  Corinda Gubler

## 2021-09-11 LAB — SURGICAL PATHOLOGY

## 2022-12-31 ENCOUNTER — Emergency Department
Admission: EM | Admit: 2022-12-31 | Discharge: 2023-01-01 | Disposition: A | Discharge status: Other Acute Inpatient Hospital | Payer: Medicaid - Out of State | Attending: Emergency Medicine | Admitting: Emergency Medicine

## 2022-12-31 DIAGNOSIS — T50901A Poisoning by unspecified drugs, medicaments and biological substances, accidental (unintentional), initial encounter: Secondary | ICD-10-CM | POA: Insufficient documentation

## 2022-12-31 DIAGNOSIS — N3000 Acute cystitis without hematuria: Secondary | ICD-10-CM | POA: Diagnosis not present

## 2022-12-31 DIAGNOSIS — F332 Major depressive disorder, recurrent severe without psychotic features: Secondary | ICD-10-CM | POA: Diagnosis present

## 2022-12-31 DIAGNOSIS — R45851 Suicidal ideations: Secondary | ICD-10-CM | POA: Diagnosis not present

## 2022-12-31 DIAGNOSIS — R079 Chest pain, unspecified: Secondary | ICD-10-CM | POA: Diagnosis not present

## 2022-12-31 DIAGNOSIS — E876 Hypokalemia: Secondary | ICD-10-CM | POA: Diagnosis not present

## 2022-12-31 DIAGNOSIS — T1491XA Suicide attempt, initial encounter: Secondary | ICD-10-CM

## 2022-12-31 NOTE — ED Triage Notes (Signed)
Arrives to [facility] from for intentional drug overdose.  Per EMS patient ingested unknown amount of Cymbalta in addition to approximately of liquor in an attempt to kill herself.  Denies any hallucinations, however patient expresses beliefs similar to delusions of telepathy, thought broadcasting, and persecution.   Patient endorses previous suicidal attempts 10+ years ago.  Endorses recreational cannabis use.   Per patient she has previous dx of schizophrenia, MDD, GAD, and BPD.

## 2023-01-01 ENCOUNTER — Other Ambulatory Visit: Payer: Self-pay

## 2023-01-01 ENCOUNTER — Inpatient Hospital Stay
Admission: RE | Admit: 2023-01-01 | Discharge: 2023-01-05 | DRG: 885 | Disposition: A | Discharge status: Home / Self Care | Payer: Medicaid - Out of State | Source: Intra-hospital | Attending: Psychiatry | Admitting: Psychiatry

## 2023-01-01 ENCOUNTER — Encounter: Payer: Self-pay | Admitting: Psychiatry

## 2023-01-01 DIAGNOSIS — Z9151 Personal history of suicidal behavior: Secondary | ICD-10-CM

## 2023-01-01 DIAGNOSIS — T43212A Poisoning by selective serotonin and norepinephrine reuptake inhibitors, intentional self-harm, initial encounter: Secondary | ICD-10-CM | POA: Diagnosis present

## 2023-01-01 DIAGNOSIS — G4700 Insomnia, unspecified: Secondary | ICD-10-CM | POA: Diagnosis present

## 2023-01-01 DIAGNOSIS — F1090 Alcohol use, unspecified, uncomplicated: Secondary | ICD-10-CM | POA: Diagnosis present

## 2023-01-01 DIAGNOSIS — Z87891 Personal history of nicotine dependence: Secondary | ICD-10-CM

## 2023-01-01 DIAGNOSIS — N390 Urinary tract infection, site not specified: Secondary | ICD-10-CM | POA: Diagnosis present

## 2023-01-01 DIAGNOSIS — Z560 Unemployment, unspecified: Secondary | ICD-10-CM

## 2023-01-01 DIAGNOSIS — Z8249 Family history of ischemic heart disease and other diseases of the circulatory system: Secondary | ICD-10-CM | POA: Diagnosis not present

## 2023-01-01 DIAGNOSIS — F332 Major depressive disorder, recurrent severe without psychotic features: Principal | ICD-10-CM | POA: Diagnosis present

## 2023-01-01 LAB — COMPREHENSIVE METABOLIC PANEL
ALT: 14 U/L (ref 0–44)
AST: 17 U/L (ref 15–41)
Albumin: 3.9 g/dL (ref 3.5–5.0)
Alkaline Phosphatase: 60 U/L (ref 38–126)
Anion gap: 11 (ref 5–15)
BUN: 7 mg/dL (ref 6–20)
CO2: 23 mmol/L (ref 22–32)
Calcium: 8.9 mg/dL (ref 8.9–10.3)
Chloride: 103 mmol/L (ref 98–111)
Creatinine, Ser: 0.88 mg/dL (ref 0.44–1.00)
GFR, Estimated: >60 mL/min (ref >60)
Glucose, Bld: 99 mg/dL (ref 70–99)
Potassium: 3.1 mmol/L — ABNORMAL LOW (ref 3.5–5.1)
Sodium: 137 mmol/L (ref 135–145)
Total Bilirubin: 0.5 mg/dL (ref 0.3–1.2)
Total Protein: 7.6 g/dL (ref 6.5–8.1)

## 2023-01-01 LAB — CBC WITH DIFFERENTIAL/PLATELET
Abs Immature Granulocytes: 0.03 10*3/uL (ref 0.00–0.07)
Basophils Absolute: 0 10*3/uL (ref 0.0–0.1)
Basophils Relative: 0 %
Eosinophils Absolute: 0.1 10*3/uL (ref 0.0–0.5)
Eosinophils Relative: 1 %
HCT: 35.4 % — ABNORMAL LOW (ref 36.0–46.0)
Hemoglobin: 10.9 g/dL — ABNORMAL LOW (ref 12.0–15.0)
Immature Granulocytes: 0 %
Lymphocytes Relative: 27 %
Lymphs Abs: 2 10*3/uL (ref 0.7–4.0)
MCH: 22.6 pg — ABNORMAL LOW (ref 26.0–34.0)
MCHC: 30.8 g/dL (ref 30.0–36.0)
MCV: 73.3 fL — ABNORMAL LOW (ref 80.0–100.0)
Monocytes Absolute: 0.5 10*3/uL (ref 0.1–1.0)
Monocytes Relative: 6 %
Neutro Abs: 5 10*3/uL (ref 1.7–7.7)
Neutrophils Relative %: 66 %
Platelets: 344 10*3/uL (ref 150–400)
RBC: 4.83 MIL/uL (ref 3.87–5.11)
RDW: 16.2 % — ABNORMAL HIGH (ref 11.5–15.5)
WBC: 7.6 10*3/uL (ref 4.0–10.5)
nRBC: 0 % (ref 0.0–0.2)

## 2023-01-01 LAB — POC URINE PREG, ED: Preg Test, Ur: NEGATIVE

## 2023-01-01 LAB — URINALYSIS, ROUTINE W REFLEX MICROSCOPIC
Bilirubin Urine: NEGATIVE
Glucose, UA: NEGATIVE mg/dL
Ketones, ur: NEGATIVE mg/dL
Nitrite: POSITIVE — AB
Protein, ur: 30 mg/dL — AB
Specific Gravity, Urine: 1.009 (ref 1.005–1.030)
pH: 6 (ref 5.0–8.0)

## 2023-01-01 LAB — ACETAMINOPHEN LEVEL: Acetaminophen (Tylenol), Serum: <10 ug/mL — ABNORMAL LOW (ref 10–30)

## 2023-01-01 LAB — ETHANOL: Alcohol, Ethyl (B): 94 mg/dL — ABNORMAL HIGH (ref <10)

## 2023-01-01 LAB — SALICYLATE LEVEL: Salicylate Lvl: <7 mg/dL — ABNORMAL LOW (ref 7.0–30.0)

## 2023-01-01 MED ORDER — NICOTINE 21 MG/24HR TD PT24
21.0000 mg | MEDICATED_PATCH | Freq: Every day | TRANSDERMAL | Status: DC
  Administered 2023-01-02 – 2023-01-05 (×4): 21 mg via TRANSDERMAL
  Filled 2023-01-01 (×4): qty 1

## 2023-01-01 MED ORDER — FOLIC ACID 1 MG PO TABS
1.0000 mg | ORAL_TABLET | Freq: Every day | ORAL | Status: DC
  Administered 2023-01-01 – 2023-01-05 (×5): 1 mg via ORAL
  Filled 2023-01-01 (×5): qty 1

## 2023-01-01 MED ORDER — ADULT MULTIVITAMIN W/MINERALS CH
1.0000 | ORAL_TABLET | Freq: Every day | ORAL | Status: DC
  Administered 2023-01-01 – 2023-01-05 (×5): 1 via ORAL
  Filled 2023-01-01 (×5): qty 1

## 2023-01-01 MED ORDER — ACETAMINOPHEN 325 MG PO TABS: 650.0000 mg | ORAL_TABLET | Freq: Four times a day (QID) | ORAL | Status: DC | PRN

## 2023-01-01 MED ORDER — MELATONIN 5 MG PO TABS
2.5000 mg | ORAL_TABLET | Freq: Every day | ORAL | Status: DC
  Administered 2023-01-01: 2.5 mg via ORAL
  Filled 2023-01-01: qty 1

## 2023-01-01 MED ORDER — THIAMINE MONONITRATE 100 MG PO TABS
100.0000 mg | ORAL_TABLET | Freq: Every day | ORAL | Status: DC
  Administered 2023-01-01 – 2023-01-05 (×5): 100 mg via ORAL
  Filled 2023-01-01 (×5): qty 1

## 2023-01-01 MED ORDER — MAGNESIUM HYDROXIDE 400 MG/5ML PO SUSP
30.0000 mL | Freq: Every day | ORAL | Status: DC | PRN
  Administered 2023-01-03: 30 mL via ORAL
  Filled 2023-01-01: qty 30

## 2023-01-01 MED ORDER — ALUM & MAG HYDROXIDE-SIMETH 200-200-20 MG/5ML PO SUSP: 30.0000 mL | ORAL | Status: DC | PRN

## 2023-01-01 MED ORDER — ONDANSETRON HCL 4 MG/2ML IJ SOLN
4.0000 mg | Freq: Once | INTRAMUSCULAR | Status: AC
  Administered 2023-01-01: 4 mg via INTRAVENOUS
  Filled 2023-01-01: qty 2

## 2023-01-01 MED ORDER — POTASSIUM CHLORIDE CRYS ER 20 MEQ PO TBCR
20.0000 meq | EXTENDED_RELEASE_TABLET | Freq: Every day | ORAL | Status: DC
  Administered 2023-01-01 – 2023-01-05 (×5): 20 meq via ORAL
  Filled 2023-01-01 (×5): qty 1

## 2023-01-01 MED ORDER — NICOTINE POLACRILEX 2 MG MT GUM
2.0000 mg | CHEWING_GUM | OROMUCOSAL | Status: DC | PRN
  Administered 2023-01-01: 2 mg via ORAL
  Filled 2023-01-01 (×2): qty 1

## 2023-01-01 MED ORDER — SULFAMETHOXAZOLE-TRIMETHOPRIM 800-160 MG PO TABS
1.0000 | ORAL_TABLET | Freq: Two times a day (BID) | ORAL | Status: DC
  Administered 2023-01-01 – 2023-01-05 (×8): 1 via ORAL
  Filled 2023-01-01 (×8): qty 1

## 2023-01-01 MED ORDER — CEPHALEXIN 500 MG PO CAPS
500.0000 mg | ORAL_CAPSULE | Freq: Two times a day (BID) | ORAL | Status: DC
  Administered 2023-01-01 (×2): 500 mg via ORAL
  Filled 2023-01-01 (×2): qty 1

## 2023-01-01 MED ORDER — THIAMINE HCL 100 MG/ML IJ SOLN
100.0000 mg | Freq: Every day | INTRAMUSCULAR | Status: DC
  Filled 2023-01-01 (×3): qty 1

## 2023-01-01 MED ORDER — LORAZEPAM 1 MG PO TABS
1.0000 mg | ORAL_TABLET | ORAL | Status: AC | PRN
  Administered 2023-01-03: 1 mg via ORAL
  Filled 2023-01-01: qty 1

## 2023-01-01 MED ORDER — SODIUM CHLORIDE 0.9 % IV BOLUS
1000.0000 mL | Freq: Once | INTRAVENOUS | Status: AC
  Administered 2023-01-01: 1000 mL via INTRAVENOUS

## 2023-01-01 MED ORDER — LORAZEPAM 2 MG/ML IJ SOLN: 1.0000 mg | INTRAMUSCULAR | Status: AC | PRN

## 2023-01-01 MED ORDER — LORAZEPAM 1 MG PO TABS
1.0000 mg | ORAL_TABLET | Freq: Once | ORAL | Status: AC
  Administered 2023-01-01: 1 mg via ORAL
  Filled 2023-01-01: qty 1

## 2023-01-01 MED ORDER — POTASSIUM CHLORIDE CRYS ER 20 MEQ PO TBCR
40.0000 meq | EXTENDED_RELEASE_TABLET | Freq: Once | ORAL | Status: DC
  Filled 2023-01-01: qty 2

## 2023-01-01 NOTE — Tx Team (Signed)
Initial Treatment Plan 01/01/2023 6:12 PM Tonette Lederer NWG:[id number]    PATIENT STRESSORS: Educational concerns   Marital or family conflict   Medication change or noncompliance     PATIENT STRENGTHS: Arboriculturist fund of knowledge  Motivation for treatment/growth  Supportive family/friends    PATIENT IDENTIFIED PROBLEMS: SI by OD  Alcohol use  Anxiety  Family conflict               DISCHARGE CRITERIA:  Ability to meet basic life and health needs Improved stabilization in mood, thinking, and/or behavior Need for constant or close observation no longer present Reduction of life-threatening or endangering symptoms to within safe limits  PRELIMINARY DISCHARGE PLAN: Outpatient therapy Return to previous living arrangement Return to previous work or school arrangements  PATIENT/FAMILY INVOLVEMENT: This treatment plan has been presented to and reviewed with the patient, Tashyra Cipollone. The patient has been given the opportunity to ask questions and make suggestions.  Shavaughn Seidl, RN 01/01/2023, 6:12 PM

## 2023-01-01 NOTE — ED Notes (Signed)
Pt given sandwich tray 

## 2023-01-01 NOTE — BH Assessment (Signed)
Comprehensive Clinical Assessment (CCA) Note  01/01/2023 Nichole Berry [id number]  Chief Complaint: Patient is a 37 year old female presenting to [facility] ED under IVC. Per triage note Arrives to [facility] from for intentional drug overdose. Per EMS patient ingested unknown amount of Cymbalta in addition to approximately of liquor in an attempt to kill herself. Denies any hallucinations, however patient expresses beliefs similar to delusions of telepathy, thought broadcasting, and persecution. Patient endorses previous suicidal attempts 10+ years ago. Endorses recreational cannabis use. Per patient she has previous dx of schizophrenia, MDD, GAD, and BPD. During assessment patient appears alert and oriented x4, calm and cooperative. Patient reports "I wanted to give up, I'm just overwhelmed, I didn't know how to handle it." Patient reports that she has 4 children but her oldest lives with her mother "she's a thief." At one point she reports that she does not have her children then she reports that 3 of her children live with her and her husband. Patient at times will inappropriately laugh during the assessment, and asked this writer "what's your nickname." Patient reports that she is from [place] but moved to [place] from [place]. Patient reports that she has a therapist but is unsure what facility she goes to and she is unaware of who prescribes her medications for her mental health. Patient reports some issues with sleep "I stay up all day and go to bed at 3am" and appetite "I haven't eaten in 3 days." When asked about HI she responds with "I can't guarantee that", when asked about AH she reports "that's kinda hard to answer" and when asked about VH she reports "if you call it seeing things."    Per Psyc NP Christal Willeen Cass patient is recommended for Inpatient Chief Complaint  Patient presents with   Suicide Attempt   Drug Overdose   Visit Diagnosis: Major Depressive Disorder, recurrent episode, severe     CCA Screening, Triage and Referral (STR)  Patient Reported Information How did you hear about [place]? Other (Comment)  Referral name: No data recorded Referral phone number: No data recorded  Whom do you see for routine medical problems? No data recorded Practice/Facility Name: No data recorded Practice/Facility Phone Number: No data recorded Name of Contact: No data recorded Contact Number: No data recorded Contact Fax Number: No data recorded Prescriber Name: No data recorded Prescriber Address (if known): No data recorded  What Is the Reason for Your Visit/Call Today? Arrives to [facility] from for intentional drug overdose.  Per EMS patient ingested unknown amount of Cymbalta in addition to approximately of liquor in an attempt to kill herself.  Denies any hallucinations, however patient expresses beliefs similar to delusions of telepathy, thought broadcasting, and persecution.   Patient endorses previous suicidal attempts 10+ years ago.  Endorses recreational cannabis use.   Per patient she has previous dx of schizophrenia, MDD, GAD, and BPD.  How Long Has This Been Causing You Problems? > than 6 months  What Do You Feel Would Help You the Most Today? No data recorded  Have You Recently Been in Any Inpatient Treatment (Hospital/Detox/Crisis Center/28-Day Program)? No data recorded Name/Location of Program/Hospital:No data recorded How Long Were You There? No data recorded When Were You Discharged? No data recorded  Have You Ever Received Services From [facility] Before? No data recorded Who Do You See at [facility]? No data recorded  Have You Recently Had Any Thoughts About Hurting Yourself? Yes  Are You Planning to Commit Suicide/Harm Yourself At This  time? No   Have you Recently Had Thoughts About Hurting Someone Else? -- (Patient did not want to answer)  Explanation: No data recorded  Have You Used Any Alcohol or Drugs in the Past 24 Hours? No  How Long Ago Did  You Use Drugs or Alcohol? No data recorded What Did You Use and How Much? No data recorded  Do You Currently Have a Therapist/Psychiatrist? Yes  Name of Therapist/Psychiatrist: Patient reports having a therapist but cannot recall what facility   Have You Been Recently Discharged From Any Office Practice or Programs? No data recorded Explanation of Discharge From Practice/Program: No data recorded    CCA Screening Triage Referral Assessment Type of Contact: Face-to-Face  Is this Initial or Reassessment? No data recorded Date Telepsych consult ordered in [facility]:  No data recorded Time Telepsych consult ordered in [facility]:  No data recorded  Patient Reported Information Reviewed? No data recorded Patient Left Without Being Seen? No data recorded Reason for Not Completing Assessment: No data recorded  Collateral Involvement: No data recorded  Does Patient Have a Court Appointed Legal Guardian? No data recorded Name and Contact of Legal Guardian: No data recorded If Minor and Not Living with Parent(s), Who has Custody? No data recorded Is CPS involved or ever been involved? Never  Is APS involved or ever been involved? Never   Patient Determined To Be At Risk for Harm To Self or Others Based on Review of Patient Reported Information or Presenting Complaint? No data recorded Method: No data recorded Availability of Means: No data recorded Intent: No data recorded Notification Required: No data recorded Additional Information for Danger to Others Potential: No data recorded Additional Comments for Danger to Others Potential: No data recorded Are There Guns or Other Weapons in Your Home? No  Types of Guns/Weapons: No data recorded Are These Weapons Safely Secured?                            No data recorded Who Could Verify You Are Able To Have These Secured: No data recorded Do You Have any Outstanding Charges, Pending Court Dates, Parole/Probation? No data recorded Contacted To  Inform of Risk of Harm To Self or Others: No data recorded  Location of Assessment: [facility] ED   Does Patient Present under Involuntary Commitment? Yes  IVC Papers Initial File Date: No data recorded  [place] of Residence: [place]   Patient Currently Receiving the Following Services: Medication Management   Determination of Need: Emergent (2 hours)   Options For Referral: No data recorded    CCA Biopsychosocial Intake/Chief Complaint:  No data recorded Current Symptoms/Problems: No data recorded  Patient Reported Schizophrenia/Schizoaffective Diagnosis in Past: Yes   Strengths: Patient is able to communicate her needs  Preferences: No data recorded Abilities: No data recorded  Type of Services Patient Feels are Needed: No data recorded  Initial Clinical Notes/Concerns: No data recorded  Mental Health Symptoms Depression:   Change in energy/activity; Difficulty Concentrating; Fatigue; Hopelessness; Worthlessness   Duration of Depressive symptoms:  Greater than two weeks   Mania:   None   Anxiety:    Difficulty concentrating; Fatigue   Psychosis:   Hallucinations   Duration of Psychotic symptoms:  Greater than six months   Trauma:   None   Obsessions:   None   Compulsions:   "Driven" to perform behaviors/acts; Poor Insight   Inattention:   None   Hyperactivity/Impulsivity:   None  Oppositional/Defiant Behaviors:   None   Emotional Irregularity:   Chronic feelings of emptiness; Mood lability   Other Mood/Personality Symptoms:  No data recorded   Mental Status Exam Appearance and self-care  Stature:   Tall   Weight:   Obese   Clothing:   Casual   Grooming:   Normal   Cosmetic use:   None   Posture/gait:   Normal   Motor activity:   Not Remarkable   Sensorium  Attention:   Normal   Concentration:   Normal   Orientation:   X5   Recall/memory:   Normal   Affect and Mood  Affect:   Appropriate   Mood:    Other (Comment)   Relating  Eye contact:   Normal   Facial expression:   Responsive   Attitude toward examiner:   Cooperative   Thought and Language  Speech flow:  Clear and Coherent   Thought content:   Appropriate to Mood and Circumstances   Preoccupation:   None   Hallucinations:   -- (Patient did not want to answer)   Organization:  No data recorded  [employer] of Knowledge:   Fair   Intelligence:   Average   Abstraction:   Functional   Judgement:   Poor   Reality Testing:   Adequate   Insight:   Lacking; Poor   Decision Making:   Impulsive   Social Functioning  Social Maturity:   Impulsive   Social Judgement:   Heedless   Stress  Stressors:   Family conflict   Coping Ability:   Exhausted   Skill Deficits:   None   Supports:   Family; Friends/Service system     Religion: Religion/Spirituality Are You A Religious Person?: No  Leisure/Recreation: Leisure / Recreation Do You Have Hobbies?: No  Exercise/Diet: Exercise/Diet Do You Exercise?: No Have You Gained or Lost A Significant Amount of Weight in the Past Six Months?: No Do You Follow a Special Diet?: No Do You Have Any Trouble Sleeping?: Yes Explanation of Sleeping Difficulties: Patient reports that she stays awake all day and goes to bed at 3am   CCA Employment/Education Employment/Work Situation: Employment / Work Situation Employment Situation: On disability Why is Patient on Disability: Mental Health How Long has Patient Been on Disability: Unknown Has Patient ever Been in the [employer]?: No  Education: Education Is Patient Currently Attending School?: No Did You Have An Individualized Education Program (IIEP): No Did You Have Any Difficulty At [employer]?: No Patient's Education Has Been Impacted by Current Illness: No   CCA Family/Childhood History Family and Relationship History: Family history Marital status: Single Does patient have  children?: Yes How many children?: 4 How is patient's relationship with their children?: Patient reports her oldest child lives with her mother and the other 3 live with her  Childhood History:  Childhood History By whom was/is the patient raised?: Mother Did patient suffer any verbal/emotional/physical/sexual abuse as a child?: No Did patient suffer from severe childhood neglect?: No Has patient ever been sexually abused/assaulted/raped as an adolescent or adult?: No Was the patient ever a victim of a crime or a disaster?: No Witnessed domestic violence?: No Has patient been affected by domestic violence as an adult?: No  Child/Adolescent Assessment:     CCA Substance Use Alcohol/Drug Use: Alcohol / Drug Use Pain Medications: see mar Prescriptions: see mar Over the Counter: see mar History of alcohol / drug use?:  (Patient reports that she did not use  any substances tonight but will not disclose what substances she does [place])                         ASAM's:  Six Dimensions of Multidimensional Assessment  Dimension 1:  Acute Intoxication and/or Withdrawal Potential:      Dimension 2:  Biomedical Conditions and Complications:      Dimension 3:  Emotional, Behavioral, or Cognitive Conditions and Complications:     Dimension 4:  Readiness to Change:     Dimension 5:  Relapse, Continued use, or Continued Problem Potential:     Dimension 6:  Recovery/Living Environment:     ASAM Severity Score:    ASAM Recommended Level of Treatment:     Substance use Disorder (SUD)    Recommendations for Services/Supports/Treatments: Recommendations for Services/Supports/Treatments Recommendations For Services/Supports/Treatments: Inpatient Hospitalization  DSM5 Diagnoses: Patient Active Problem List   Diagnosis Date Noted   Retained products of conception after delivery without hemorrhage 09/09/2021   Previous cesarean section complicating pregnancy 07/31/2021   [redacted] weeks  gestation of pregnancy 07/31/2021   Hidradenitis suppurativa of right axilla 09/14/2018   Hidradenitis suppurativa    Delivery of pregnancy by cesarean section 07/07/2018   Postpartum care following cesarean delivery 07/07/2018   History of cesarean section 06/23/2018    Patient Centered Plan: Patient is on the following Treatment Plan(s):  Depression   Referrals to Alternative Service(s): Referred to Alternative Service(s):   Place:   Date:   Time:    Referred to Alternative Service(s):   Place:   Date:   Time:    Referred to Alternative Service(s):   Place:   Date:   Time:    Referred to Alternative Service(s):   Place:   Date:   Time:      @[email or web address] @  [employer], LCAS-A

## 2023-01-01 NOTE — ED Provider Notes (Signed)
[facility] Provider Note    Event Date/Time   First MD Initiated Contact with Patient 12/31/22 2339     (approximate)   History   Suicide Attempt and Drug Overdose   HPI  Nichole Berry is a 37 y.o. female with extensive psychiatric history here with intentional overdose.  The patient states that over the last several weeks, she has had worsening depression.  She has had occasional thoughts of wanting to kill herself.  She found out that her significant other had been cheating on her and she felt like she was being used.  She subsequently began taking a large amount of Cymbalta as well as what she describes as antiemetic pills.  She does not recall what they were.  This occurred starting since 4 PM today.  She then drank 750 cc of alcohol.  She states she does not want to be here and she remained suicidal.  She has a remote history of suicide attempt but states that it been multiple years.     Physical Exam   Triage Vital Signs: ED Triage Vitals [12/31/22 2344]  Enc Vitals Group     BP (!) 149/105     Pulse Rate 91     Resp 18     Temp 98.6 F (37 C)     Temp Source Oral     SpO2 100 %     Weight (!) 355 lb (161 kg)     Height      Head Circumference      Peak Flow      Pain Score      Pain Loc      Pain Edu?      Excl. in GC?     Most recent vital signs: Vitals:   12/31/22 2344  BP: (!) 149/105  Pulse: 91  Resp: 18  Temp: 98.6 F (37 C)  SpO2: 100%     General: Awake, crying, tearful. CV:  Good peripheral perfusion.  Resp:  Normal work of breathing.  Lungs clear. Abd:  No distention.  No significant tenderness to palpation.  No rebound or guarding. Other:  Alert, oriented.  Tearful.  Endorses ongoing suicidal ideation.   ED Results / Procedures / Treatments   Labs (all labs ordered are listed, but only abnormal results are displayed) Labs Reviewed  CBC WITH DIFFERENTIAL/PLATELET - Abnormal; Notable for the following  components:      Result Value   Hemoglobin 10.9 (*)    HCT 35.4 (*)    MCV 73.3 (*)    MCH 22.6 (*)    RDW 16.2 (*)    All other components within normal limits  COMPREHENSIVE METABOLIC PANEL - Abnormal; Notable for the following components:   Potassium 3.1 (*)    All other components within normal limits  ETHANOL - Abnormal; Notable for the following components:   Alcohol, Ethyl (B) 94 (*)    All other components within normal limits  ACETAMINOPHEN LEVEL - Abnormal; Notable for the following components:   Acetaminophen (Tylenol), Serum <10 (*)    All other components within normal limits  SALICYLATE LEVEL - Abnormal; Notable for the following components:   Salicylate Lvl <7.0 (*)    All other components within normal limits  URINALYSIS, ROUTINE W REFLEX MICROSCOPIC - Abnormal; Notable for the following components:   Color, Urine YELLOW (*)    APPearance HAZY (*)    Hgb urine dipstick SMALL (*)    Protein, ur 30 (*)  Nitrite POSITIVE (*)    Leukocytes,Ua MODERATE (*)    Bacteria, UA RARE (*)    All other components within normal limits  URINE CULTURE  POC URINE PREG, ED  POC URINE PREG, ED     EKG Normal sinus rhythm, ventricular rate 97.  PR 159, QRS 86, QTc 437.  No acute ST elevations or depressions.   RADIOLOGY None   I also independently reviewed and agree with radiologist interpretations.   PROCEDURES:  Critical Care performed: No  .1-3 Lead EKG Interpretation  Performed by: Shaune Pollack, MD Authorized by: Shaune Pollack, MD     Interpretation: normal     ECG rate:  80-100   ECG rate assessment: normal     Rhythm: sinus rhythm     Ectopy: none     Conduction: normal   Comments:     Indication: Chest pain     MEDICATIONS ORDERED IN ED: Medications  cephALEXin (KEFLEX) capsule 500 mg (500 mg Oral Given 01/01/23 0114)  sodium chloride 0.9 % bolus 1,000 mL (1,000 mLs Intravenous New Bag/Given 01/01/23 0027)     IMPRESSION / MDM /  ASSESSMENT AND PLAN / ED COURSE  I reviewed the triage vital signs and the nursing notes.                              Differential diagnosis includes, but is not limited to, intentional overdose, polypharmacy overdose, alcohol intoxication, worsening depression, borderline personality  Patient's presentation is most consistent with acute presentation with potential threat to life or bodily function.  The patient is on the cardiac monitor to evaluate for evidence of arrhythmia and/or significant heart rate changes  37 year old female here with ingestion of SSRIs and suicide attempt.  Patient has stable.  No vomiting.  EKG is normal.  Intervals are acceptable.  Alcohol level 94 but lab work is otherwise reassuring.  CMP with mild hypokalemia which was repleted.  CBC unremarkable.  Tylenol level negative.  Salicylate negative.  UA with possible UTI.  Will treat for this.  Otherwise, patient is medically stable for psychiatric disposition.  IVC was completed by me.    FINAL CLINICAL IMPRESSION(S) / ED DIAGNOSES   Final diagnoses:  Suicide attempt (HCC)  Acute cystitis without hematuria     Rx / DC Orders   ED Discharge Orders     None        Note:  This document was prepared using Dragon voice recognition software and may include unintentional dictation errors.   Shaune Pollack, MD 01/01/23 [id number]

## 2023-01-01 NOTE — ED Notes (Signed)
Pt given phone to call husband 

## 2023-01-01 NOTE — ED Notes (Signed)
Updated Poison control on pt

## 2023-01-01 NOTE — ED Notes (Signed)
Pt given breakfast tray. Pt sitting up eating breakfast at this time.

## 2023-01-01 NOTE — ED Notes (Signed)
Pt given lunch tray.

## 2023-01-01 NOTE — Plan of Care (Signed)
New admission.  Problem: Education: Goal: Knowledge of General Education information will improve Description: Including pain rating scale, medication(s)/side effects and non-pharmacologic comfort measures Outcome: Not Progressing   Problem: Health Behavior/Discharge Planning: Goal: Ability to manage health-related needs will improve Outcome: Not Progressing   Problem: Clinical Measurements: Goal: Ability to maintain clinical measurements within normal limits will improve Outcome: Not Progressing Goal: Will remain free from infection Outcome: Not Progressing Goal: Diagnostic test results will improve Outcome: Not Progressing Goal: Respiratory complications will improve Outcome: Not Progressing Goal: Cardiovascular complication will be avoided Outcome: Not Progressing   Problem: Activity: Goal: Risk for activity intolerance will decrease Outcome: Not Progressing   Problem: Nutrition: Goal: Adequate nutrition will be maintained Outcome: Not Progressing   Problem: Coping: Goal: Level of anxiety will decrease Outcome: Not Progressing   Problem: Elimination: Goal: Will not experience complications related to bowel motility Outcome: Not Progressing Goal: Will not experience complications related to urinary retention Outcome: Not Progressing   Problem: Pain Managment: Goal: General experience of comfort will improve Outcome: Not Progressing   Problem: Safety: Goal: Ability to remain free from injury will improve Outcome: Not Progressing   Problem: Skin Integrity: Goal: Risk for impaired skin integrity will decrease Outcome: Not Progressing   Problem: Physical Regulation: Goal: Complications related to the disease process, condition or treatment will be avoided or minimized Outcome: Not Progressing   Problem: Safety: Goal: Ability to remain free from injury will improve Outcome: Not Progressing   Problem: Education: Goal: Knowledge of [facility]  Education information/materials will improve Outcome: Not Progressing Goal: Emotional status will improve Outcome: Not Progressing Goal: Mental status will improve Outcome: Not Progressing Goal: Verbalization of understanding the information provided will improve Outcome: Not Progressing   Problem: Safety: Goal: Periods of time without injury will increase Outcome: Not Progressing   Problem: Self-Concept: Goal: Ability to disclose and discuss suicidal ideas will improve Outcome: Not Progressing Goal: Will verbalize positive feelings about self Outcome: Not Progressing

## 2023-01-01 NOTE — ED Notes (Signed)
Pt given shower supplies and shower set up for pt by EDT Abigail. Pt provided with clean scrubs.

## 2023-01-01 NOTE — Progress Notes (Signed)
Admission Note:   Report was received from [place], [place] on a 37 year-old female who presents IVC in no acute distress for the treatment of SI by OD and Alcohol use. Patient appears tearful and depressed. Patient was calm and cooperative with admission process. Patient stated that she was at a boiling point and "I took too many Cymbalta and drank a lot of liquor with it". Patient denied depression, but endorsed anxiety, rating it a "6/10", stating that "being here", and "missing my kids "is why she's feeling this way. Patient also stated that "I'm not depressed, I just don't want to be here, I feel like a prisoner. I'm worried that I'm going to fail my classes because I'm here". Patient also denied SI/HI/AVH and pain at this time. Patient stated that her stressors are her family and school. Patient's goal for treatment is to "keep my mind quiet, I'm an over-thinker. I tend to make things up and believe them". Patient has a past medical history of Anemia and Depression, and Hydradenitis. Skin was assessed with Wylene Men, RN and found to be clear of any abnormal marks apart from multiple tattoos to bilateral arms, upper back, outside of bilateral lower legs; patient has bumps under her breasts. Patient searched and no contraband found and unit policies explained and understanding verbalized. Consents obtained. Food and fluids offered, and both accepted. Patient had no additional questions or concerns to voice at this time. Patient remains safe on the unit.

## 2023-01-01 NOTE — ED Notes (Signed)
IVC/pending psych consult 

## 2023-01-01 NOTE — Progress Notes (Signed)
Patient calm and pleasant during assessment denying SI/HI/AVH. Pt isolative to her room tonight but did come for her night time medication. Check MAR. Pt given education, support, and encouragement to be active in her treatment plan. Pt being monitored Q 15 minutes for safety per unit protocol, remains safe on the unit

## 2023-01-01 NOTE — ED Notes (Signed)
Report given to [facility] RN in [facility]

## 2023-01-02 DIAGNOSIS — F332 Major depressive disorder, recurrent severe without psychotic features: Principal | ICD-10-CM

## 2023-01-02 LAB — BASIC METABOLIC PANEL
Anion gap: 8 (ref 5–15)
BUN: 11 mg/dL (ref 6–20)
CO2: 25 mmol/L (ref 22–32)
Calcium: 8.6 mg/dL — ABNORMAL LOW (ref 8.9–10.3)
Chloride: 106 mmol/L (ref 98–111)
Creatinine, Ser: 0.96 mg/dL (ref 0.44–1.00)
GFR, Estimated: >60 mL/min (ref >60)
Glucose, Bld: 92 mg/dL (ref 70–99)
Potassium: 3.7 mmol/L (ref 3.5–5.1)
Sodium: 139 mmol/L (ref 135–145)

## 2023-01-02 LAB — URINE CULTURE

## 2023-01-02 MED ORDER — TOPIRAMATE 25 MG PO TABS
25.0000 mg | ORAL_TABLET | ORAL | Status: DC
  Administered 2023-01-02 – 2023-01-05 (×6): 25 mg via ORAL
  Filled 2023-01-02 (×7): qty 1

## 2023-01-02 MED ORDER — FLUOXETINE HCL 20 MG PO CAPS
20.0000 mg | ORAL_CAPSULE | Freq: Every day | ORAL | Status: DC
  Administered 2023-01-02 – 2023-01-05 (×4): 20 mg via ORAL
  Filled 2023-01-02 (×4): qty 1

## 2023-01-02 MED ORDER — TRAZODONE HCL 100 MG PO TABS
100.0000 mg | ORAL_TABLET | Freq: Every day | ORAL | Status: DC
  Administered 2023-01-02 – 2023-01-04 (×3): 100 mg via ORAL
  Filled 2023-01-02 (×3): qty 1

## 2023-01-02 NOTE — H&P (Signed)
Psychiatric Admission Assessment Adult  Patient Identification: Nichole Berry MRN:  [id number] Date of Evaluation:  01/02/2023 Chief Complaint:  Severe recurrent major depression without psychotic features (HCC) [F33.2] Principal Diagnosis: Severe recurrent major depression without psychotic features (HCC) Diagnosis:  Principal Problem:   Severe recurrent major depression without psychotic features (HCC)  History of Present Illness:  Nichole Berry is a 37 year old African-American female who is involuntarily admitted to inpatient adult psychiatry for overdose. Arrives to [facility] from for intentional drug overdose. Per EMS patient ingested unknown amount of Cymbalta in addition to approximately of liquor in an attempt to kill herself. Denies any hallucinations, however patient expresses beliefs similar to delusions of telepathy, thought broadcasting, and persecution. Patient endorses previous suicidal attempts 10+ years ago. Endorses recreational cannabis use. Per patient she has previous dx of schizophrenia, MDD, GAD, and BPD.  She tells me that she has been overwhelmed trying to be a mom, going to school, and working.  She has 4 children ages 52, 54, 7, and 74.  She saw a psychiatrist approximately 15 years ago at [facility].  She has an appointment on Tuesday at [facility] in [place].  Past medications include Zyprexa, Zoloft, Depakote, Seroquel, Lexapro.  She has never been on Prozac.  She was placed on Cymbalta by her PCP 6 months ago at 20 mg.  She says that it is not working.  She took an overdose of her Cymbalta and alcohol.  She complains of racing thoughts and difficulty sleeping at night along with depressed mood, mood swings, and irritability.  I talked to her about Topamax for weight loss and mood stability along with Prozac and trazodone.  Associated Signs/Symptoms: Depression Symptoms:  depressed mood, insomnia, psychomotor agitation, suicidal attempt, (Hypo) Manic  Symptoms:  Impulsivity, Anxiety Symptoms:  Excessive Worry, Psychotic Symptoms:   None PTSD Symptoms: None Total Time spent with patient: 1 hour  Past Psychiatric History: As above  Is the patient at risk to self? Yes.    Has the patient been a risk to self in the past 6 months? Yes.    Has the patient been a risk to self within the distant past?  Yes Is the patient a risk to others? No.  Has the patient been a risk to others in the past 6 months? No.  Has the patient been a risk to others within the distant past? No.   [place] Scale:  Flowsheet Row Admission (Current) from 01/01/2023 in [facility] INPATIENT BEHAVIORAL MEDICINE ED from 12/31/2022 in [facility] Emergency Department at [facility] ED to Hosp-Admission (Discharged) from 09/09/2021 in [facility]IVERY  C-SSRS RISK CATEGORY No Risk High Risk No Risk        Prior Inpatient Therapy: Yes If yes, describe [place] behavioral Prior Outpatient Therapy: No. If yes, describe no  Alcohol Screening: 1. How often do you have a drink containing alcohol?: Monthly or less 2. How many drinks containing alcohol do you have on a typical day when you are drinking?: 1 or 2 3. How often do you have six or more drinks on one occasion?: Never AUDIT-C Score: 1 4. How often during the last year have you found that you were not able to stop drinking once you had started?: Never 5. How often during the last year have you failed to do what was normally expected from you because of drinking?: Never 6. How often during the last year have you needed a first drink in the morning to  get yourself going after a heavy drinking session?: Never 7. How often during the last year have you had a feeling of guilt of remorse after drinking?: Never 8. How often during the last year have you been unable to remember what happened the night before because you had been drinking?: Never 9. Have you or someone else been injured as a  result of your drinking?: No 10. Has a relative or friend or a doctor or another health worker been concerned about your drinking or suggested you cut down?: No Alcohol Use Disorder Identification Test Final Score (AUDIT): 1 Alcohol Brief Interventions/Follow-up: Alcohol education/Brief advice Substance Abuse History in the last 12 months:  Yes.   Consequences of Substance Abuse: NA Previous Psychotropic Medications: Yes  Psychological Evaluations: Yes  Past Medical History:  Past Medical History:  Diagnosis Date   Anemia    Hidradenitis    Strep throat    FINISHED ANTIBIOTICS ON 09-12-18    Past Surgical History:  Procedure Laterality Date   CESAREAN SECTION  2006, 2013   x2   CESAREAN SECTION N/A 07/05/2018   Procedure: REPEAT CESAREAN SECTION;  Surgeon: Nadara Mustard, MD;  Location: [facility] ORS;  Service: Obstetrics;  Laterality: N/A;  Female born @ 37 Apgars: 9/9 Weight: 6lbs 2 ozs   DILATATION & CURETTAGE/HYSTEROSCOPY WITH MYOSURE N/A 09/09/2021   Procedure: DILATATION & CURETTAGE/HYSTEROSCOPY WITH MYOSURE AND PLACEMENT OF UTERINE TAMPONADE BALLOON;  Surgeon: Christeen Douglas, MD;  Location: [facility] ORS;  Service: Gynecology;  Laterality: N/A;   HYDRADENITIS EXCISION Right 09/14/2018   Procedure: EXCISION HIDRADENITIS AXILLA - RIGHT;  Surgeon: Duanne Guess, MD;  Location: [facility] ORS;  Service: General;  Laterality: Right;   Family History:  Family History  Problem Relation Age of Onset   Hypertension Mother    Diabetes Mother    Kidney disease Father    Cancer Maternal Grandmother    Diabetes Maternal Grandmother    Hypertension Maternal Grandmother    Pancreatic cancer Maternal Grandmother    Cancer Maternal Grandfather    Diabetes Maternal Grandfather    Hypertension Maternal Grandfather    Pancreatic cancer Maternal Grandfather    Colon cancer Neg Hx    Breast cancer Neg Hx    Family Psychiatric  History: Unremarkable Tobacco Screening:  Social History   Tobacco  Use  Smoking Status Former   Packs/day: 0.50   Years: 14.00   Additional pack years: 0.00   Total pack years: 7.00   Types: Cigarettes   Quit date: 09/17/2017   Years since quitting: 5.2  Smokeless Tobacco Never    BH Tobacco Counseling     Are you interested in Tobacco Cessation Medications?  Yes, implement Nicotene Replacement Protocol Counseled patient on smoking cessation:  Refused/Declined practical counseling Reason Tobacco Screening Not Completed: No value filed.       Social History:  Social History   Substance and Sexual Activity  Alcohol Use No     Social History   Substance and Sexual Activity  Drug Use No    Additional Social History:                           Allergies:   Allergies  Allergen Reactions   Latex Hives   Lab Results:  Results for orders placed or performed during the hospital encounter of 01/01/23 (from the past 48 hour(s))  Basic metabolic panel     Status: Abnormal   Collection Time: 01/02/23  7:16  AM  Result Value Ref Range   Sodium 139 135 - 145 mmol/L   Potassium 3.7 3.5 - 5.1 mmol/L   Chloride 106 98 - 111 mmol/L   CO2 25 22 - 32 mmol/L   Glucose, Bld 92 70 - 99 mg/dL    Comment: Glucose reference range applies only to samples taken after fasting for at least 8 hours.   BUN 11 6 - 20 mg/dL   Creatinine, Ser 1.61 0.44 - 1.00 mg/dL   Calcium 8.6 (L) 8.9 - 10.3 mg/dL   GFR, Estimated >09 >60 mL/min    Comment: (NOTE) Calculated using the CKD-EPI Creatinine Equation (2021)    Anion gap 8 5 - 15    Comment: Performed at [facility], [address] [place], [place] [address]    Blood Alcohol level:  Lab Results  Component Value Date   ETH 94 (H) 01/01/2023    Metabolic Disorder Labs:  No results found for: "HGBA1C", "MPG" No results found for: "PROLACTIN" No results found for: "CHOL", "TRIG", "HDL", "CHOLHDL", "VLDL", "LDLCALC"  Current Medications: Current Facility-Administered Medications   Medication Dose Route Frequency Provider Last Rate Last Admin   acetaminophen (TYLENOL) tablet 650 mg  650 mg Oral Q6H PRN Clapacs, Jackquline Denmark, MD       alum & mag hydroxide-simeth (MAALOX/MYLANTA) 200-200-20 MG/5ML suspension 30 mL  30 mL Oral Q4H PRN Clapacs, Jackquline Denmark, MD       folic acid (FOLVITE) tablet 1 mg  1 mg Oral Daily Clapacs, John T, MD   1 mg at 01/02/23 0838   LORazepam (ATIVAN) tablet 1-4 mg  1-4 mg Oral Q1H PRN Clapacs, Jackquline Denmark, MD       Or   LORazepam (ATIVAN) injection 1-4 mg  1-4 mg Intravenous Q1H PRN Clapacs, Jackquline Denmark, MD       magnesium hydroxide (MILK OF MAGNESIA) suspension 30 mL  30 mL Oral Daily PRN Clapacs, Jackquline Denmark, MD       multivitamin with minerals tablet 1 tablet  1 tablet Oral Daily Clapacs, Jackquline Denmark, MD   1 tablet at 01/02/23 8119   nicotine (NICODERM CQ - dosed in mg/24 hours) patch 21 mg  21 mg Transdermal Daily Clapacs, Jackquline Denmark, MD   21 mg at 01/02/23 1478   nicotine polacrilex (NICORETTE) gum 2 mg  2 mg Oral PRN Clapacs, Jackquline Denmark, MD   2 mg at 01/01/23 1712   potassium chloride SA (KLOR-CON M) CR tablet 20 mEq  20 mEq Oral Daily Clapacs, Jackquline Denmark, MD   20 mEq at 01/02/23 0839   sulfamethoxazole-trimethoprim (BACTRIM DS) 800-160 MG per tablet 1 tablet  1 tablet Oral Q12H Clapacs, Jackquline Denmark, MD   1 tablet at 01/02/23 2956   thiamine (VITAMIN B1) tablet 100 mg  100 mg Oral Daily Clapacs, Jackquline Denmark, MD   100 mg at 01/02/23 2130   Or   thiamine (VITAMIN B1) injection 100 mg  100 mg Intravenous Daily Clapacs, Jackquline Denmark, MD       PTA Medications: Medications Prior to Admission  Medication Sig Dispense Refill Last Dose   docusate sodium (COLACE) 100 MG capsule Take 1 capsule (100 mg total) by mouth 2 (two) times daily. (Patient not taking: Reported on 01/01/2023) 10 capsule 0    ibuprofen (ADVIL) 800 MG tablet Take 1 tablet (800 mg total) by mouth every 8 (eight) hours as needed for moderate pain or cramping. (Patient not taking: Reported on 01/01/2023) 30 tablet 0  Musculoskeletal: Strength & Muscle Tone: within normal limits Gait & Station: normal Patient leans: N/A            Psychiatric Specialty Exam:  Presentation  General Appearance: No data recorded Eye Contact:No data recorded Speech:No data recorded Speech Volume:No data recorded Handedness:No data recorded  Mood and Affect  Mood:No data recorded Affect:No data recorded  Thought Process  Thought Processes:No data recorded Duration of Psychotic Symptoms:N/A Past Diagnosis of Schizophrenia or Psychoactive disorder: Yes  Descriptions of Associations:No data recorded Orientation:No data recorded Thought Content:No data recorded Hallucinations:No data recorded Ideas of Reference:No data recorded Suicidal Thoughts:No data recorded Homicidal Thoughts:No data recorded  Sensorium  Memory:No data recorded Judgment:No data recorded Insight:No data recorded  Executive Functions  Concentration:No data recorded Attention Span:No data recorded Recall:No data recorded Fund of Knowledge:No data recorded Language:No data recorded  Psychomotor Activity  Psychomotor Activity:No data recorded  Assets  Assets:No data recorded  Sleep  Sleep:No data recorded   Physical Exam: Physical Exam Constitutional:      Appearance: Normal appearance.  HENT:     Head: Normocephalic and atraumatic.     Mouth/Throat:     Pharynx: Oropharynx is clear.  Eyes:     Pupils: Pupils are equal, round, and reactive to light.  Cardiovascular:     Rate and Rhythm: Normal rate and regular rhythm.  Pulmonary:     Effort: Pulmonary effort is normal.     Breath sounds: Normal breath sounds.  Abdominal:     General: Abdomen is flat.     Palpations: Abdomen is soft.  Musculoskeletal:        General: Normal range of motion.  Skin:    General: Skin is warm and dry.  Neurological:     General: No focal deficit present.     Mental Status: She is alert. Mental status is at baseline.   Psychiatric:        Attention and Perception: Attention and perception normal.        Mood and Affect: Mood is anxious and depressed. Affect is labile and flat.        Speech: Speech normal.        Behavior: Behavior is cooperative.        Thought Content: Thought content normal.        Cognition and Memory: Cognition and memory normal.        Judgment: Judgment normal.    Review of Systems  Constitutional: Negative.   HENT: Negative.    Eyes: Negative.   Respiratory: Negative.    Cardiovascular: Negative.   Gastrointestinal: Negative.   Genitourinary: Negative.   Musculoskeletal: Negative.   Skin: Negative.   Neurological: Negative.   Endo/Heme/Allergies: Negative.   Psychiatric/Behavioral:  The patient has insomnia.    Blood pressure 106/63, pulse 75, temperature 98.8 F (37.1 C), temperature source Oral, resp. rate 19, height 5\' 6"  (1.676 m), weight (!) 159.2 kg, SpO2 100 %. Body mass index is 56.65 kg/m.  Treatment Plan Summary: Daily contact with patient to assess and evaluate symptoms and progress in treatment, Medication management, and Plan start trazodone, Prozac, Topamax.  Observation Level/Precautions:  15 minute checks  Laboratory:  CBC Chemistry Profile  Psychotherapy:    Medications:    Consultations:    Discharge Concerns:    Estimated LOS:  Other:     Physician Treatment Plan for Primary Diagnosis: Severe recurrent major depression without psychotic features (HCC) Long Term Goal(s): Improvement in symptoms so as ready for discharge  Short Term Goals: Ability to identify changes in lifestyle to reduce recurrence of condition will improve, Ability to verbalize feelings will improve, Ability to disclose and discuss suicidal ideas, Ability to demonstrate self-control will improve, Ability to identify and develop effective coping behaviors will improve, Ability to maintain clinical measurements within normal limits will improve, Compliance with prescribed  medications will improve, and Ability to identify triggers associated with substance abuse/mental health issues will improve  Physician Treatment Plan for Secondary Diagnosis: Principal Problem:   Severe recurrent major depression without psychotic features (HCC)   I certify that inpatient services furnished can reasonably be expected to improve the patient's condition.    Sarina Ill, DO 5/18/20241:38 PM

## 2023-01-02 NOTE — BHH Suicide Risk Assessment (Signed)
[facility] Admission Suicide Risk Assessment   Nursing information obtained from:  Patient Demographic factors:  NA Current Mental Status:  NA Loss Factors:  NA Historical Factors:  NA Risk Reduction Factors:  Responsible for children under 36 years of age, Sense of responsibility to family, Living with another person, especially a relative  Total Time spent with patient: 1 hour Principal Problem: Severe recurrent major depression without psychotic features (HCC) Diagnosis:  Principal Problem:   Severe recurrent major depression without psychotic features (HCC)  Subjective Data: Arrives to [facility] from for intentional drug overdose. Per EMS patient ingested unknown amount of Cymbalta in addition to approximately of liquor in an attempt to kill herself. Denies any hallucinations, however patient expresses beliefs similar to delusions of telepathy, thought broadcasting, and persecution. Patient endorses previous suicidal attempts 10+ years ago. Endorses recreational cannabis use. Per patient she has previous dx of schizophrenia, MDD, GAD, and BPD.   Continued Clinical Symptoms:  Alcohol Use Disorder Identification Test Final Score (AUDIT): 1 The "Alcohol Use Disorders Identification Test", Guidelines for Use in Primary Care, Second Edition.  World Science writer [facility]). Score between 0-7:  no or low risk or alcohol related problems. Score between 8-15:  moderate risk of alcohol related problems. Score between 16-19:  high risk of alcohol related problems. Score 20 or above:  warrants further diagnostic evaluation for alcohol dependence and treatment.   CLINICAL FACTORS:   Depression:   Anhedonia   Musculoskeletal: Strength & Muscle Tone: within normal limits Gait & Station: normal Patient leans: N/A  Psychiatric Specialty Exam:  Presentation  General Appearance: No data recorded Eye Contact:No data recorded Speech:No data recorded Speech Volume:No data recorded Handedness:No  data recorded  Mood and Affect  Mood:No data recorded Affect:No data recorded  Thought Process  Thought Processes:No data recorded Descriptions of Associations:No data recorded Orientation:No data recorded Thought Content:No data recorded History of Schizophrenia/Schizoaffective disorder:Yes  Duration of Psychotic Symptoms:Greater than six months  Hallucinations:No data recorded Ideas of Reference:No data recorded Suicidal Thoughts:No data recorded Homicidal Thoughts:No data recorded  Sensorium  Memory:No data recorded Judgment:No data recorded Insight:No data recorded  Executive Functions  Concentration:No data recorded Attention Span:No data recorded Recall:No data recorded Fund of Knowledge:No data recorded Language:No data recorded  Psychomotor Activity  Psychomotor Activity:No data recorded  Assets  Assets:No data recorded  Sleep  Sleep:No data recorded    Blood pressure 106/63, pulse 75, temperature 98.8 F (37.1 C), temperature source Oral, resp. rate 19, height 5\' 6"  (1.676 m), weight (!) 159.2 kg, SpO2 100 %. Body mass index is 56.65 kg/m.   COGNITIVE FEATURES THAT CONTRIBUTE TO RISK:  None    SUICIDE RISK:   Mild:  Suicidal ideation of limited frequency, intensity, duration, and specificity.  There are no identifiable plans, no associated intent, mild dysphoria and related symptoms, good self-control (both objective and subjective assessment), few other risk factors, and identifiable protective factors, including available and accessible social support.  PLAN OF CARE: See orders  I certify that inpatient services furnished can reasonably be expected to improve the patient's condition.   Dianey Suchy Tresea Mall, DO 01/02/2023, 1:34 PM

## 2023-01-02 NOTE — Group Note (Signed)
LCSW Group Therapy Note   Group Date: 01/02/2023 Start Time: 1340 End Time: 1420   Type of Therapy and Topic:  Group Therapy: Stress Mangament  Participation Level:  Active     Summary of Patient Progress:  The patient attended group. The patient expressed that she likes to take her kids to the park and attending school. The patient stated that she has no problems with nurturing herself, but does have problems sleeping. That she don't always get at least 5 hours of sleep.      Marshell Levan, LCSWA 01/02/2023  2:30 PM

## 2023-01-02 NOTE — Progress Notes (Signed)
Pt denies SI/HI/AVH and verbally agrees to approach staff if these become apparent or before harming themselves/others. Rates depression 0/10. Rates anxiety 0/10. Rates pain 0/10.  Pt has been out of her room for most of the day. Pt has been on the phone a lot throughout the day. Pt is animated and doing well. She stated that she had a little episode where she missed her 36 year old but is doing okay now. Scheduled medications administered to pt, per MD orders. RN provided support and encouragement to pt. Q15 min safety checks implemented and continued. Pt is safe on the unit. Plan of care on going and no other concerns expressed at this time.  01/02/23 0838  Psych Admission Type (Psych Patients Only)  Admission Status Involuntary  Psychosocial Assessment  Patient Complaints None  Eye Contact Fair  Facial Expression Animated  Affect Appropriate to circumstance  Speech Logical/coherent  Interaction Assertive  Motor Activity Slow  Appearance/Hygiene Unremarkable;In scrubs  Behavior Characteristics Cooperative;Appropriate to situation;Calm  Mood Pleasant  Aggressive Behavior  Effect No apparent injury  Thought Process  Coherency WDL  Content WDL  Delusions None reported or observed  Perception WDL  Hallucination None reported or observed  Judgment WDL  Confusion None  Danger to Self  Current suicidal ideation? Denies  Danger to Others  Danger to Others None reported or observed
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
## 2023-01-02 NOTE — [facility] Suicide Risk Assessment (Signed)
[facility] INPATIENT:  Family/Significant Other Suicide Prevention Education  Suicide Prevention Education:  Education Completed; husband/ Ned Grace [phone],  has been identified by the patient as the family member/significant other with whom the patient will be residing, and identified as the person(s) who will aid the patient in the event of a mental health crisis (suicidal ideations/suicide attempt).  With written consent from the patient, the family member/significant other has been provided the following suicide prevention education, prior to the and/or following the discharge of the patient.  The suicide prevention education provided includes the following: Suicide risk factors Suicide prevention and interventions National Suicide Hotline telephone number [facility] assessment telephone number [facility] Emergency Assistance 911 [facility] and/or Residential Mobile Crisis Unit telephone number  Request made of family/significant other to: Remove weapons (e.g., guns, rifles, knives), all items previously/currently identified as safety concern.   Remove drugs/medications (over-the-counter, prescriptions, illicit drugs), all items previously/currently identified as a safety concern.  The family member/significant other verbalizes understanding of the suicide prevention education information provided.  The family member/significant other agrees to remove the items of safety concern listed above.   The patients husband stated that he no concerns about the patient returning home. Stated that the patient was concerned about not receiving any medication after the first night. The husband stated that the patient had not taken any medication  for 15 years and that coping skills usually helped. Husband stated that he notice the patients feelings changing when it came to her ist child he did not go into details). The husband reported that this was his first time experiencing  anything like this with his wife. That everything has always been good and that his wife was happy. The husband was informed to continue to check in on the patient and asked about how she is feeling once she returns home. Was educated to communicate and listen to what the patient needs. Husband reported that there was guns in the home but the patient does not have any access to them. The husband was informed to make sure the patient does not have any access to guns, medication, and to hide knives.    Marshell Levan 01/02/2023, 1:28 PM

## 2023-01-03 LAB — URINE CULTURE: Culture: >=100000 — AB

## 2023-01-03 NOTE — B[facility] Group Notes (Signed)
Type of Therapy:  Activity-Recreation  Participation Level:  Active  Participation Quality:  Appropriate  Affect:  Appropriate  Cognitive:  Appropriate  Insight:  Appropriate  Engagement in Group:  Engaged  Modes of Intervention:  Activity  Summary of Progress/Problems: 

## 2023-01-03 NOTE — Progress Notes (Signed)
Patient came to nurses station asking for something to help "calm my emotions". Patient was given PRN Ativan, in which she tolerated medication administration well, without any issues.

## 2023-01-03 NOTE — Progress Notes (Signed)
[facility] MD Progress Note  01/03/2023 2:39 PM Nichole Berry  MRN:  [id number] Subjective: Nichole Berry is seen on rounds.  She says that she is doing well.  She denies any side effects from her medications.  Mood and affect are good.  Nurses report no issues.  So far so good. Principal Problem: Severe recurrent major depression without psychotic features (HCC) Diagnosis: Principal Problem:   Severe recurrent major depression without psychotic features (HCC)  Total Time spent with patient: 15 minutes  Past Psychiatric History: Depression  Past Medical History:  Past Medical History:  Diagnosis Date   Anemia    Hidradenitis    Strep throat    FINISHED ANTIBIOTICS ON 09-12-18    Past Surgical History:  Procedure Laterality Date   CESAREAN SECTION  2006, 2013   x2   CESAREAN SECTION N/A 07/05/2018   Procedure: REPEAT CESAREAN SECTION;  Surgeon: Nadara Mustard, MD;  Location: [facility] ORS;  Service: Obstetrics;  Laterality: N/A;  Female born @ [birth year] Apgars: 9/9 Weight: 6lbs 2 ozs   DILATATION & CURETTAGE/HYSTEROSCOPY WITH MYOSURE N/A 09/09/2021   Procedure: DILATATION & CURETTAGE/HYSTEROSCOPY WITH MYOSURE AND PLACEMENT OF UTERINE TAMPONADE BALLOON;  Surgeon: Christeen Douglas, MD;  Location: [facility] ORS;  Service: Gynecology;  Laterality: N/A;   HYDRADENITIS EXCISION Right 09/14/2018   Procedure: EXCISION HIDRADENITIS AXILLA - RIGHT;  Surgeon: Duanne Guess, MD;  Location: [facility] ORS;  Service: General;  Laterality: Right;   Family History:  Family History  Problem Relation Age of Onset   Hypertension Mother    Diabetes Mother    Kidney disease Father    Cancer Maternal Grandmother    Diabetes Maternal Grandmother    Hypertension Maternal Grandmother    Pancreatic cancer Maternal Grandmother    Cancer Maternal Grandfather    Diabetes Maternal Grandfather    Hypertension Maternal Grandfather    Pancreatic cancer Maternal Grandfather    Colon cancer Neg Hx    Breast cancer Neg Hx    Family  Psychiatric  History: Unremarkable Social History:  Social History   Substance and Sexual Activity  Alcohol Use No     Social History   Substance and Sexual Activity  Drug Use No    Social History   Socioeconomic History   Marital status: Married    Spouse name: Nichole Berry   Number of children: Not on file   Years of education: Not on file   Highest education level: Not on file  Occupational History   Occupation: Unemployed  Tobacco Use   Smoking status: Former    Packs/day: 0.50    Years: 14.00    Additional pack years: 0.00    Total pack years: 7.00    Types: Cigarettes    Quit date: 09/17/2017    Years since quitting: 5.2   Smokeless tobacco: Never  Vaping Use   Vaping Use: Never used  Substance and Sexual Activity   Alcohol use: No   Drug use: No   Sexual activity: Not Currently    Birth control/protection: Injection  Other Topics Concern   Not on file  Social History Narrative   Not on file   Social Determinants of Health   Financial Resource Strain: Not on file  Food Insecurity: No Food Insecurity (01/01/2023)   Hunger Vital Sign    Worried About Running Out of Food in the Last Year: Never true    Ran Out of Food in the Last Year: Never true  Transportation Needs: No Transportation Needs (01/01/2023)  PRAPARE - Administrator, Civil Service (Medical): No    Lack of Transportation (Non-Medical): No  Physical Activity: Not on file  Stress: Not on file  Social Connections: Not on file   Additional Social History:                         Sleep: Good  Appetite:  Good  Current Medications: Current Facility-Administered Medications  Medication Dose Route Frequency Provider Last Rate Last Admin   acetaminophen (TYLENOL) tablet 650 mg  650 mg Oral Q6H PRN Clapacs, Jackquline Denmark, MD       alum & mag hydroxide-simeth (MAALOX/MYLANTA) 200-200-20 MG/5ML suspension 30 mL  30 mL Oral Q4H PRN Clapacs, Jackquline Denmark, MD       FLUoxetine (PROZAC) capsule 20  mg  20 mg Oral Daily Sarina Ill, DO   20 mg at 01/03/23 1610   folic acid (FOLVITE) tablet 1 mg  1 mg Oral Daily Clapacs, Jackquline Denmark, MD   1 mg at 01/03/23 0811   LORazepam (ATIVAN) tablet 1-4 mg  1-4 mg Oral Q1H PRN Clapacs, Jackquline Denmark, MD       Or   LORazepam (ATIVAN) injection 1-4 mg  1-4 mg Intravenous Q1H PRN Clapacs, Jackquline Denmark, MD       magnesium hydroxide (MILK OF MAGNESIA) suspension 30 mL  30 mL Oral Daily PRN Clapacs, Jackquline Denmark, MD   30 mL at 01/03/23 0811   multivitamin with minerals tablet 1 tablet  1 tablet Oral Daily Clapacs, Jackquline Denmark, MD   1 tablet at 01/03/23 9604   nicotine (NICODERM CQ - dosed in mg/24 hours) patch 21 mg  21 mg Transdermal Daily Clapacs, Jackquline Denmark, MD   21 mg at 01/03/23 5409   nicotine polacrilex (NICORETTE) gum 2 mg  2 mg Oral PRN Clapacs, Jackquline Denmark, MD   2 mg at 01/01/23 1712   potassium chloride SA (KLOR-CON M) CR tablet 20 mEq  20 mEq Oral Daily Clapacs, Jackquline Denmark, MD   20 mEq at 01/03/23 0811   sulfamethoxazole-trimethoprim (BACTRIM DS) 800-160 MG per tablet 1 tablet  1 tablet Oral Q12H Clapacs, Jackquline Denmark, MD   1 tablet at 01/03/23 8119   thiamine (VITAMIN B1) tablet 100 mg  100 mg Oral Daily Clapacs, John T, MD   100 mg at 01/03/23 0811   topiramate (TOPAMAX) tablet 25 mg  25 mg Oral [id number] Sarina Ill, DO   25 mg at 01/03/23 1478   traZODone (DESYREL) tablet 100 mg  100 mg Oral QHS Sarina Ill, DO   100 mg at 01/02/23 2112    Lab Results:  Results for orders placed or performed during the hospital encounter of 01/01/23 (from the past 48 hour(s))  Basic metabolic panel     Status: Abnormal   Collection Time: 01/02/23  7:16 AM  Result Value Ref Range   Sodium 139 135 - 145 mmol/L   Potassium 3.7 3.5 - 5.1 mmol/L   Chloride 106 98 - 111 mmol/L   CO2 25 22 - 32 mmol/L   Glucose, Bld 92 70 - 99 mg/dL    Comment: Glucose reference range applies only to samples taken after fasting for at least 8 hours.   BUN 11 6 - 20 mg/dL   Creatinine, Ser  2.95 0.44 - 1.00 mg/dL   Calcium 8.6 (L) 8.9 - 10.3 mg/dL   GFR, Estimated >62 >13 mL/min    Comment: (NOTE)  Calculated using the CKD-EPI Creatinine Equation (2021)    Anion gap 8 5 - 15    Comment: Performed at [facility], [address] [place], [place] [address]    Blood Alcohol level:  Lab Results  Component Value Date   ETH 94 (H) 01/01/2023    Metabolic Disorder Labs: No results found for: "HGBA1C", "MPG" No results found for: "PROLACTIN" No results found for: "CHOL", "TRIG", "HDL", "CHOLHDL", "VLDL", "LDLCALC"  Physical Findings: AIMS: Facial and Oral Movements Muscles of Facial Expression: None, normal Lips and Perioral Area: None, normal Jaw: None, normal Tongue: None, normal,Extremity Movements Upper (arms, wrists, hands, fingers): None, normal Lower (legs, knees, ankles, toes): None, normal, Trunk Movements Neck, shoulders, hips: None, normal, Overall Severity Severity of abnormal movements (highest score from questions above): None, normal Incapacitation due to abnormal movements: None, normal Patient's awareness of abnormal movements (rate only patient's report): No Awareness, Dental Status Current problems with teeth and/or dentures?: No Does patient usually wear dentures?: No  CIWA:    COWS:     Musculoskeletal: Strength & Muscle Tone: within normal limits Gait & Station: normal Patient leans: N/A  Psychiatric Specialty Exam:  Presentation  General Appearance: No data recorded Eye Contact:No data recorded Speech:No data recorded Speech Volume:No data recorded Handedness:No data recorded  Mood and Affect  Mood:No data recorded Affect:No data recorded  Thought Process  Thought Processes:No data recorded Descriptions of Associations:No data recorded Orientation:No data recorded Thought Content:No data recorded History of Schizophrenia/Schizoaffective disorder:Yes  Duration of Psychotic Symptoms:Greater than six  months  Hallucinations:No data recorded Ideas of Reference:No data recorded Suicidal Thoughts:No data recorded Homicidal Thoughts:No data recorded  Sensorium  Memory:No data recorded Judgment:No data recorded Insight:No data recorded  Executive Functions  Concentration:No data recorded Attention Span:No data recorded Recall:No data recorded Fund of Knowledge:No data recorded Language:No data recorded  Psychomotor Activity  Psychomotor Activity:No data recorded  Assets  Assets:No data recorded  Sleep  Sleep:No data recorded    Blood pressure (!) 149/93, pulse 89, temperature 99.1 F (37.3 C), temperature source Oral, resp. rate 19, height 5\' 6"  (1.676 m), weight (!) 159.2 kg, SpO2 100 %. Body mass index is 56.65 kg/m.   Treatment Plan Summary: Daily contact with patient to assess and evaluate symptoms and progress in treatment, Medication management, and Plan continue current medications.  Sarina Ill, DO 01/03/2023, 2:39 PM

## 2023-01-03 NOTE — Progress Notes (Signed)
D - The patient enjoyed a visit and spent time watching television with peers.  Her mood was bright and affect was in sync.  Nichole Berry denied thoughts of harming herself and others.  No symptoms of withdrawal noted.  The patient accepted her evening medications and fell asleep before 2200.    A - Medications provided as ordered.    R - The patient mood was stable and her energy levels were adequate.  No symptoms of psychosis observed.

## 2023-01-03 NOTE — BHH Group Notes (Signed)
[facility] Group Notes:  (Nursing/MHT/Case Management/Adjunct)  Date:  01/03/2023  Time:  10:47 AM  Type of Therapy:  Psychoeducational Skills  Participation Level:  Active  Participation Quality:  Appropriate  Affect:  Appropriate  Cognitive:  Alert and Appropriate  Insight:  Appropriate and Good  Engagement in Group:  Engaged  Modes of Intervention:  Discussion, Education, and Exploration  Summary of Progress/Problems:  PsychoEducational Group - Patients were educated on identifying triggers for warning signs of mental health decompensation. Patients were then asked to identify new patterns of behavior, healthy coping skill and lifestyle choices they would like to change to develop healthier mental health. Nichole Berry participated, shared she would like '' to start exercising and get into marital counseling and counseling for myself. [name] 01/03/2023, 10:47 AM

## 2023-01-03 NOTE — Group Note (Signed)
Date:  01/03/2023 Time:  9:01 PM  Group Topic/Focus:  Wrap-Up Group:   The focus of this group is to help patients review their daily goal of treatment and discuss progress on daily workbooks.    Participation Level:  Active  Participation Quality:  Appropriate, Attentive, and Sharing  Affect:  Appropriate  Cognitive:  Alert, Appropriate, and Oriented  Insight: Appropriate and Good  Engagement in Group:  Engaged  Modes of Intervention:  Limit-setting  Additional Comments:     Maglione,Sunny Aguon E 01/03/2023, 9:01 PM

## 2023-01-03 NOTE — Progress Notes (Signed)
Patient presents pleasant and cooperative. Denies SI, Hi, AVH. Medication compliant. Appropriate with staff and peers. Reports mood improved. Misses 37 year old child. Noted on phone and had visitor early shift. Smiling talking with staff and peers. Able to make needs known. Encouragement and support provided. Safety checks maintained. Medications given as prescribed. Pt receptive and remains safe on unit with q 15 min checks.

## 2023-01-03 NOTE — Plan of Care (Signed)
D- Patient alert and oriented. Patient presents in a pleasant mood on assessment stating that she slept "good" last night and had complaints of constipation, in which she requested PRN medication to help with relief. Patient denies SI, HI, AVH, and pain at this time. Patient also denies any signs/symptoms of depression and anxiety, stating that her mood is a "10/10". Patient has no stated goals for today.  A- Scheduled medications administered to patient, per MD orders. Support and encouragement provided.  Routine safety checks conducted every 15 minutes.  Patient informed to notify staff with problems or concerns.  R- No adverse drug reactions noted. Patient contracts for safety at this time. Patient compliant with medications and treatment plan. Patient receptive, calm, and cooperative. Patient interacts well with others on the unit. Patient remains safe at this time.  Problem: Education: Goal: Knowledge of General Education information will improve Description: Including pain rating scale, medication(s)/side effects and non-pharmacologic comfort measures Outcome: Progressing   Problem: Health Behavior/Discharge Planning: Goal: Ability to manage health-related needs will improve Outcome: Progressing   Problem: Clinical Measurements: Goal: Ability to maintain clinical measurements within normal limits will improve Outcome: Progressing Goal: Will remain free from infection Outcome: Progressing Goal: Diagnostic test results will improve Outcome: Progressing Goal: Respiratory complications will improve Outcome: Progressing Goal: Cardiovascular complication will be avoided Outcome: Progressing   Problem: Activity: Goal: Risk for activity intolerance will decrease Outcome: Progressing   Problem: Nutrition: Goal: Adequate nutrition will be maintained Outcome: Progressing   Problem: Coping: Goal: Level of anxiety will decrease Outcome: Progressing   Problem: Elimination: Goal: Will  not experience complications related to bowel motility Outcome: Progressing Goal: Will not experience complications related to urinary retention Outcome: Progressing   Problem: Pain Managment: Goal: General experience of comfort will improve Outcome: Progressing   Problem: Safety: Goal: Ability to remain free from injury will improve Outcome: Progressing   Problem: Skin Integrity: Goal: Risk for impaired skin integrity will decrease Outcome: Progressing   Problem: Physical Regulation: Goal: Complications related to the disease process, condition or treatment will be avoided or minimized Outcome: Progressing   Problem: Safety: Goal: Ability to remain free from injury will improve Outcome: Progressing   Problem: Education: Goal: Knowledge of [facility] Education information/materials will improve Outcome: Progressing Goal: Emotional status will improve Outcome: Progressing Goal: Mental status will improve Outcome: Progressing Goal: Verbalization of understanding the information provided will improve Outcome: Progressing   Problem: Safety: Goal: Periods of time without injury will increase Outcome: Progressing   Problem: Self-Concept: Goal: Ability to disclose and discuss suicidal ideas will improve Outcome: Progressing Goal: Will verbalize positive feelings about self Outcome: Progressing

## 2023-01-03 NOTE — Plan of Care (Signed)
  Problem: Education: Goal: Knowledge of General Education information will improve Description: Including pain rating scale, medication(s)/side effects and non-pharmacologic comfort measures Outcome: Progressing   Problem: Nutrition: Goal: Adequate nutrition will be maintained Outcome: Progressing   Problem: Coping: Goal: Level of anxiety will decrease Outcome: Progressing   Problem: Safety: Goal: Ability to remain free from injury will improve Outcome: Progressing   Problem: Safety: Goal: Ability to remain free from injury will improve Outcome: Progressing   

## 2023-01-03 NOTE — Progress Notes (Signed)
Patient was given Prune juice, from dining services, to try and help with constipation relief.

## 2023-01-04 ENCOUNTER — Other Ambulatory Visit: Payer: Self-pay

## 2023-01-04 MED ORDER — VITAMIN B-1 100 MG PO TABS
100.0000 mg | ORAL_TABLET | Freq: Every day | ORAL | 0 refills | Status: AC
  Filled 2023-01-04: qty 10, 10d supply, fill #0

## 2023-01-04 MED ORDER — TOPIRAMATE 25 MG PO TABS: 25.0000 mg | ORAL_TABLET | ORAL | 1 refills | Status: DC

## 2023-01-04 MED ORDER — SULFAMETHOXAZOLE-TRIMETHOPRIM 800-160 MG PO TABS
1.0000 | ORAL_TABLET | Freq: Two times a day (BID) | ORAL | 0 refills | Status: AC
  Filled 2023-01-04: qty 6, 3d supply, fill #0

## 2023-01-04 MED ORDER — LURASIDONE HCL 40 MG PO TABS
40.0000 mg | ORAL_TABLET | Freq: Every day | ORAL | Status: DC
  Administered 2023-01-04: 40 mg via ORAL
  Filled 2023-01-04: qty 1

## 2023-01-04 MED ORDER — NICOTINE POLACRILEX 2 MG MT GUM
2.0000 mg | CHEWING_GUM | OROMUCOSAL | 0 refills | Status: AC | PRN
  Filled 2023-01-04: qty 50, 10d supply, fill #0

## 2023-01-04 MED ORDER — VITAMIN B-1 100 MG PO TABS: 100.0000 mg | ORAL_TABLET | Freq: Every day | ORAL | 1 refills | Status: DC

## 2023-01-04 MED ORDER — TOPIRAMATE 25 MG PO TABS
25.0000 mg | ORAL_TABLET | ORAL | 0 refills | Status: AC
  Filled 2023-01-04: qty 20, 10d supply, fill #0

## 2023-01-04 MED ORDER — FLUOXETINE HCL 20 MG PO CAPS
20.0000 mg | ORAL_CAPSULE | Freq: Every day | ORAL | 0 refills | Status: AC
  Filled 2023-01-04: qty 10, 10d supply, fill #0

## 2023-01-04 MED ORDER — LURASIDONE HCL 40 MG PO TABS
40.0000 mg | ORAL_TABLET | Freq: Every day | ORAL | 0 refills | Status: AC
  Filled 2023-01-04 (×2): qty 10, 10d supply, fill #0

## 2023-01-04 MED ORDER — NICOTINE 21 MG/24HR TD PT24: 21.0000 mg | MEDICATED_PATCH | Freq: Every day | TRANSDERMAL | 1 refills | Status: DC

## 2023-01-04 MED ORDER — QUETIAPINE FUMARATE 100 MG PO TABS: 100.0000 mg | ORAL_TABLET | Freq: Every day | ORAL | Status: DC

## 2023-01-04 MED ORDER — TRAZODONE HCL 100 MG PO TABS
100.0000 mg | ORAL_TABLET | Freq: Every day | ORAL | 0 refills | Status: AC
  Filled 2023-01-04: qty 10, 10d supply, fill #0

## 2023-01-04 MED ORDER — TRAZODONE HCL 100 MG PO TABS: 100.0000 mg | ORAL_TABLET | Freq: Every day | ORAL | 1 refills | Status: DC

## 2023-01-04 MED ORDER — NICOTINE POLACRILEX 2 MG MT GUM: 2.0000 mg | CHEWING_GUM | OROMUCOSAL | 1 refills | Status: DC | PRN

## 2023-01-04 MED ORDER — NICOTINE 21 MG/24HR TD PT24
21.0000 mg | MEDICATED_PATCH | Freq: Every day | TRANSDERMAL | 0 refills | Status: AC
  Filled 2023-01-04: qty 14, 14d supply, fill #0

## 2023-01-04 MED ORDER — LURASIDONE HCL 40 MG PO TABS: 40.0000 mg | ORAL_TABLET | Freq: Every day | ORAL | 1 refills | Status: DC

## 2023-01-04 MED ORDER — FLUOXETINE HCL 20 MG PO CAPS: 20.0000 mg | ORAL_CAPSULE | Freq: Every day | ORAL | 1 refills | Status: DC

## 2023-01-04 NOTE — Group Note (Signed)
Date:  01/04/2023 Time:  6:04 PM  Group Topic/Focus:  Outdoor Recreation    Participation Level:  Active  Participation Quality:  Appropriate  Affect:  Appropriate  Cognitive:  Appropriate  Insight: Appropriate  Engagement in Group:  Engaged  Modes of Intervention:  Activity  Additional Comments:    Emmit Oriley Travis Marvin Grabill 01/04/2023, 6:04 PM  

## 2023-01-04 NOTE — Progress Notes (Signed)
Patient ID: Nichole Berry, female   DOB: Nov 24, 1985, 37 y.o.   MRN: [id number] Patient presents with anxious mood, affect congruent. Henrine has been pleasant and cooperative on the unit, interactive in the milieu, and attending group. She has been compliant with all am medications. She reports '' my main concern is really just getting home. I miss my daughter and I feel worried with being behind in my schoolwork and how my husband will manage missing work. When will the doctor be in to see me?''  Allowed pt to ventilate and support given. She shares she '' talked to my husband about helping me out with some of the chores since I'm in school and managing a one year old. '' Pt denies any AH VH or SI HI. She is safe, completed her self inventory form and rates depression and hopelessness at 0/10 on scale, 10 being worst 0 being none. She rates her anxiety at 2/10 on same scale. Pt states goal is '' to rethink my actions and stop and ask are they necessary.  Pt is safe, will con't to monitor.

## 2023-01-04 NOTE — [facility] Group Notes (Signed)
[facility] Group Notes:  (Nursing/MHT/Case Management/Adjunct)  Date:  01/04/2023  Time:  9:49 AM  Type of Therapy:   community meeting  Participation Level:  Active  Participation Quality:  Appropriate  Affect:  Appropriate  Cognitive:  Appropriate  Insight:  Appropriate  Engagement in Group:  Engaged  Modes of Intervention:  Discussion and Education  Summary of Progress/Problems:  Nichole Berry Nichole Berry 01/04/2023, 9:49 AM 

## 2023-01-04 NOTE — Group Note (Signed)
Date:  01/04/2023 Time:  8:57 PM  Group Topic/Focus:  Wrap-Up Group:   The focus of this group is to help patients review their daily goal of treatment and discuss progress on daily workbooks.    Participation Level:  Active  Participation Quality:  Appropriate and Attentive  Affect:  Appropriate  Cognitive:  Alert and Appropriate  Insight: Appropriate  Engagement in Group:  Limited  Modes of Intervention:  Discussion and Education  Additional Comments:     Maglione,Dashley Monts E 01/04/2023, 8:57 PM

## 2023-01-04 NOTE — Progress Notes (Signed)
[facility] MD Progress Note  01/04/2023 2:02 PM Nichole Berry  MRN:  [id number] Subjective: Follow-up 37 year old woman with overdose and alcohol abuse.  Patient met with me and met with treatment team.  She insists that she was not attempting to harm her self with a pill overdose although looking at the chart there is evidence that she was making suicidal statements when she came in.  Patient says her mood today is much better.  Somewhat tending to minimize her symptoms.  She does admit to chronic severe insomnia and mood instability.  She acknowledges that her alcohol use has become out of control.  No behavior issues today and the patient is requesting discharge. Principal Problem: Severe recurrent major depression without psychotic features (HCC) Diagnosis: Principal Problem:   Severe recurrent major depression without psychotic features (HCC)  Total Time spent with patient: 30 minutes  Past Psychiatric History: Past history of mood instability with various diagnoses as well as alcohol use  Past Medical History:  Past Medical History:  Diagnosis Date   Anemia    Hidradenitis    Strep throat    FINISHED ANTIBIOTICS ON 09-12-18    Past Surgical History:  Procedure Laterality Date   CESAREAN SECTION  2006, 2013   x2   CESAREAN SECTION N/A 07/05/2018   Procedure: REPEAT CESAREAN SECTION;  Surgeon: Nadara Mustard, MD;  Location: [facility] ORS;  Service: Obstetrics;  Laterality: N/A;  Female born @ [birth year] Apgars: 9/9 Weight: 6lbs 2 ozs   DILATATION & CURETTAGE/HYSTEROSCOPY WITH MYOSURE N/A 09/09/2021   Procedure: DILATATION & CURETTAGE/HYSTEROSCOPY WITH MYOSURE AND PLACEMENT OF UTERINE TAMPONADE BALLOON;  Surgeon: Christeen Douglas, MD;  Location: [facility] ORS;  Service: Gynecology;  Laterality: N/A;   HYDRADENITIS EXCISION Right 09/14/2018   Procedure: EXCISION HIDRADENITIS AXILLA - RIGHT;  Surgeon: Duanne Guess, MD;  Location: [facility] ORS;  Service: General;  Laterality: Right;   Family History:   Family History  Problem Relation Age of Onset   Hypertension Mother    Diabetes Mother    Kidney disease Father    Cancer Maternal Grandmother    Diabetes Maternal Grandmother    Hypertension Maternal Grandmother    Pancreatic cancer Maternal Grandmother    Cancer Maternal Grandfather    Diabetes Maternal Grandfather    Hypertension Maternal Grandfather    Pancreatic cancer Maternal Grandfather    Colon cancer Neg Hx    Breast cancer Neg Hx    Family Psychiatric  History: See previous Social History:  Social History   Substance and Sexual Activity  Alcohol Use No     Social History   Substance and Sexual Activity  Drug Use No    Social History   Socioeconomic History   Marital status: Married    Spouse name: Warden/ranger   Number of children: Not on file   Years of education: Not on file   Highest education level: Not on file  Occupational History   Occupation: Unemployed  Tobacco Use   Smoking status: Former    Packs/day: 0.50    Years: 14.00    Additional pack years: 0.00    Total pack years: 7.00    Types: Cigarettes    Quit date: 09/17/2017    Years since quitting: 5.3   Smokeless tobacco: Never  Vaping Use   Vaping Use: Never used  Substance and Sexual Activity   Alcohol use: No   Drug use: No   Sexual activity: Not Currently    Birth control/protection: Injection  Other Topics Concern  Not on file  Social History Narrative   Not on file   Social Determinants of Health   Financial Resource Strain: Not on file  Food Insecurity: No Food Insecurity (01/01/2023)   Hunger Vital Sign    Worried About Running Out of Food in the Last Year: Never true    Ran Out of Food in the Last Year: Never true  Transportation Needs: No Transportation Needs (01/01/2023)   PRAPARE - Administrator, Civil Service (Medical): No    Lack of Transportation (Non-Medical): No  Physical Activity: Not on file  Stress: Not on file  Social Connections: Not on file    Additional Social History:                         Sleep: Fair  Appetite:  Fair  Current Medications: Current Facility-Administered Medications  Medication Dose Route Frequency Provider Last Rate Last Admin   acetaminophen (TYLENOL) tablet 650 mg  650 mg Oral Q6H PRN Gleb Mcguire T, MD       alum & mag hydroxide-simeth (MAALOX/MYLANTA) 200-200-20 MG/5ML suspension 30 mL  30 mL Oral Q4H PRN Shanequa Whitenight T, MD       FLUoxetine (PROZAC) capsule 20 mg  20 mg Oral Daily Sarina Ill, DO   20 mg at 01/04/23 0831   folic acid (FOLVITE) tablet 1 mg  1 mg Oral Daily Rea Reser T, MD   1 mg at 01/04/23 0830   LORazepam (ATIVAN) tablet 1-4 mg  1-4 mg Oral Q1H PRN Therma Lasure T, MD   1 mg at 01/03/23 1515   Or   LORazepam (ATIVAN) injection 1-4 mg  1-4 mg Intravenous Q1H PRN Lenetta Piche T, MD       lurasidone (LATUDA) tablet 40 mg  40 mg Oral QHS Analeise Mccleery T, MD       magnesium hydroxide (MILK OF MAGNESIA) suspension 30 mL  30 mL Oral Daily PRN Tahisha Hakim, Jackquline Denmark, MD   30 mL at 01/03/23 1610   multivitamin with minerals tablet 1 tablet  1 tablet Oral Daily Kadarius Cuffe, Jackquline Denmark, MD   1 tablet at 01/04/23 0831   nicotine (NICODERM CQ - dosed in mg/24 hours) patch 21 mg  21 mg Transdermal Daily Katerina Zurn T, MD   21 mg at 01/04/23 0831   nicotine polacrilex (NICORETTE) gum 2 mg  2 mg Oral PRN Dezzie Badilla T, MD   2 mg at 01/01/23 1712   potassium chloride SA (KLOR-CON M) CR tablet 20 mEq  20 mEq Oral Daily Barnett Elzey T, MD   20 mEq at 01/04/23 0830   sulfamethoxazole-trimethoprim (BACTRIM DS) 800-160 MG per tablet 1 tablet  1 tablet Oral Q12H Zula Hovsepian T, MD   1 tablet at 01/04/23 0830   thiamine (VITAMIN B1) tablet 100 mg  100 mg Oral Daily Isais Klipfel T, MD   100 mg at 01/04/23 0831   topiramate (TOPAMAX) tablet 25 mg  25 mg Oral [id number] Sarina Ill, DO   25 mg at 01/04/23 9604   traZODone (DESYREL) tablet 100 mg  100 mg Oral QHS Sarina Ill, DO   100 mg at 01/03/23 2113    Lab Results: No results found for this or any previous visit (from the past 48 hour(s)).  Blood Alcohol level:  Lab Results  Component Value Date   ETH 94 (H) 01/01/2023    Metabolic Disorder Labs: No results found for: "  HGBA1C", "MPG" No results found for: "PROLACTIN" No results found for: "CHOL", "TRIG", "HDL", "CHOLHDL", "VLDL", "LDLCALC"  Physical Findings: AIMS: Facial and Oral Movements Muscles of Facial Expression: None, normal Lips and Perioral Area: None, normal Jaw: None, normal Tongue: None, normal,Extremity Movements Upper (arms, wrists, hands, fingers): None, normal Lower (legs, knees, ankles, toes): None, normal, Trunk Movements Neck, shoulders, hips: None, normal, Overall Severity Severity of abnormal movements (highest score from questions above): None, normal Incapacitation due to abnormal movements: None, normal Patient's awareness of abnormal movements (rate only patient's report): No Awareness, Dental Status Current problems with teeth and/or dentures?: No Does patient usually wear dentures?: No  CIWA:    COWS:     Musculoskeletal: Strength & Muscle Tone: within normal limits Gait & Station: normal Patient leans: N/A  Psychiatric Specialty Exam:  Presentation  General Appearance: No data recorded Eye Contact:No data recorded Speech:No data recorded Speech Volume:No data recorded Handedness:No data recorded  Mood and Affect  Mood:No data recorded Affect:No data recorded  Thought Process  Thought Processes:No data recorded Descriptions of Associations:No data recorded Orientation:No data recorded Thought Content:No data recorded History of Schizophrenia/Schizoaffective disorder:Yes  Duration of Psychotic Symptoms:Greater than six months  Hallucinations:No data recorded Ideas of Reference:No data recorded Suicidal Thoughts:No data recorded Homicidal Thoughts:No data recorded  Sensorium   Memory:No data recorded Judgment:No data recorded Insight:No data recorded  Executive Functions  Concentration:No data recorded Attention Span:No data recorded Recall:No data recorded Fund of Knowledge:No data recorded Language:No data recorded  Psychomotor Activity  Psychomotor Activity:No data recorded  Assets  Assets:No data recorded  Sleep  Sleep:No data recorded   Physical Exam: Physical Exam Vitals and nursing note reviewed.  Constitutional:      Appearance: Normal appearance.  HENT:     Head: Normocephalic and atraumatic.     Mouth/Throat:     Pharynx: Oropharynx is clear.  Eyes:     Pupils: Pupils are equal, round, and reactive to light.  Cardiovascular:     Rate and Rhythm: Normal rate and regular rhythm.  Pulmonary:     Effort: Pulmonary effort is normal.     Breath sounds: Normal breath sounds.  Abdominal:     General: Abdomen is flat.     Palpations: Abdomen is soft.  Musculoskeletal:        General: Normal range of motion.  Skin:    General: Skin is warm and dry.  Neurological:     General: No focal deficit present.     Mental Status: She is alert. Mental status is at baseline.  Psychiatric:        Attention and Perception: Attention normal.        Mood and Affect: Mood normal.        Speech: Speech normal.        Behavior: Behavior normal.        Thought Content: Thought content normal.        Cognition and Memory: Cognition normal.        Judgment: Judgment normal.    Review of Systems  Constitutional: Negative.   HENT: Negative.    Eyes: Negative.   Respiratory: Negative.    Cardiovascular: Negative.   Gastrointestinal: Negative.   Musculoskeletal: Negative.   Skin: Negative.   Neurological: Negative.   Psychiatric/Behavioral:  The patient has insomnia.    Blood pressure 110/61, pulse 85, temperature 98.9 F (37.2 C), temperature source Oral, resp. rate 20, height 5\' 6"  (1.676 m), weight (!) 159.2 kg, SpO2 100 %.  Body mass index  is 56.65 kg/m.   Treatment Plan Summary: Medication management and Plan reviewed medication.  Suggested a mood stabilizer be added.  Patient did not want to take Seroquel or olanzapine because of weight issues.  We will try Latuda 40 mg instead.  Monitor for any alcohol withdrawal symptoms still.  Engage in individual and group therapy.  Reassess tomorrow for possible discharge with referral to outpatient treatment most likely at [facility].  Mordecai Rasmussen, MD 01/04/2023, 2:02 PM

## 2023-01-04 NOTE — Group Note (Signed)
Recreation Therapy Group Note   Group Topic:Goal Setting  Group Date: 01/04/2023 Start Time: 1000 End Time: 1100 Facilitators: Rosina Lowenstein, LRT, CTRS Location:  Craft Room  Group Description: Vision Board. Patients were given many different magazines, a glue stick, markers, and a piece of cardstock paper. LRT and pts discussed the importance of having goals in life. LRT and pts discussed the difference between short-term and long-term goals, as well as what a SMART goal is. LRT encouraged pts to create a vision board, with images they picked and then cut out with safety scissors from the magazine, for themselves, that capture their short and long-term goals. LRT encouraged pts to show and explain their vision board to the group. LRT offered to laminate vision board once dry and complete.   Goal Area(s) Addressed:  Patient will gain knowledge of short vs. long term goals.  Patient will identify goals for themselves. Patient will practice setting SMART goals. Patient will verbalize their goals to LRT and peers.  Affect/Mood: Appropriate and Happy   Participation Level: Active and Engaged   Participation Quality: Independent   Behavior: Appropriate, Calm, and Eager   Speech/Thought Process: Coherent   Insight: Good   Judgement: Good   Modes of Intervention: Art   Patient Response to Interventions:  Attentive, Engaged, Interested , and Receptive   Education Outcome:  Acknowledges education   Clinical Observations/Individualized Feedback: Nichole Berry was active in their participation of session activities and group discussion. Pt identified "I want to go home, travel and take a trip to [place] and I want to graduate school... I have 6 weeks left until I graduate with my bachelors from [employer]" as her goals. Pt was noted to be singing and dancing in her seat while working on her vision board. Pt appropriately identified different images and put them out for her vision board. Pt  had a very bright affect and interacted well with peers and LRT duration of session.   Plan: Continue to engage patient in RT group sessions 2-3x/week.   Rosina Lowenstein, LRT, CTRS 01/04/2023 11:20 AM

## 2023-01-04 NOTE — BH IP Treatment Plan (Signed)
Interdisciplinary Treatment and Diagnostic Plan Update  01/04/2023 Time of Session: 08:56 Nichole Berry MRN: [id number]  Principal Diagnosis: Severe recurrent major depression without psychotic features [facility])  Secondary Diagnoses: Principal Problem:   Severe recurrent major depression without psychotic features (HCC)   Current Medications:  Current Facility-Administered Medications  Medication Dose Route Frequency Provider Last Rate Last Admin   acetaminophen (TYLENOL) tablet 650 mg  650 mg Oral Q6H PRN Clapacs, Jackquline Denmark, MD       alum & mag hydroxide-simeth (MAALOX/MYLANTA) 200-200-20 MG/5ML suspension 30 mL  30 mL Oral Q4H PRN Clapacs, John T, MD       FLUoxetine (PROZAC) capsule 20 mg  20 mg Oral Daily Sarina Ill, DO   20 mg at 01/04/23 0831   folic acid (FOLVITE) tablet 1 mg  1 mg Oral Daily Clapacs, John T, MD   1 mg at 01/04/23 0830   LORazepam (ATIVAN) tablet 1-4 mg  1-4 mg Oral Q1H PRN Clapacs, John T, MD   1 mg at 01/03/23 1515   Or   LORazepam (ATIVAN) injection 1-4 mg  1-4 mg Intravenous Q1H PRN Clapacs, John T, MD       magnesium hydroxide (MILK OF MAGNESIA) suspension 30 mL  30 mL Oral Daily PRN Clapacs, Jackquline Denmark, MD   30 mL at 01/03/23 4098   multivitamin with minerals tablet 1 tablet  1 tablet Oral Daily Clapacs, Jackquline Denmark, MD   1 tablet at 01/04/23 0831   nicotine (NICODERM CQ - dosed in mg/24 hours) patch 21 mg  21 mg Transdermal Daily Clapacs, John T, MD   21 mg at 01/04/23 0831   nicotine polacrilex (NICORETTE) gum 2 mg  2 mg Oral PRN Clapacs, John T, MD   2 mg at 01/01/23 1712   potassium chloride SA (KLOR-CON M) CR tablet 20 mEq  20 mEq Oral Daily Clapacs, John T, MD   20 mEq at 01/04/23 0830   sulfamethoxazole-trimethoprim (BACTRIM DS) 800-160 MG per tablet 1 tablet  1 tablet Oral Q12H Clapacs, John T, MD   1 tablet at 01/04/23 0830   thiamine (VITAMIN B1) tablet 100 mg  100 mg Oral Daily Clapacs, John T, MD   100 mg at 01/04/23 0831   topiramate (TOPAMAX)  tablet 25 mg  25 mg Oral [id number] Sarina Ill, DO   25 mg at 01/04/23 1191   traZODone (DESYREL) tablet 100 mg  100 mg Oral QHS Sarina Ill, DO   100 mg at 01/03/23 2113   PTA Medications: Medications Prior to Admission  Medication Sig Dispense Refill Last Dose   docusate sodium (COLACE) 100 MG capsule Take 1 capsule (100 mg total) by mouth 2 (two) times daily. (Patient not taking: Reported on 01/01/2023) 10 capsule 0    ibuprofen (ADVIL) 800 MG tablet Take 1 tablet (800 mg total) by mouth every 8 (eight) hours as needed for moderate pain or cramping. (Patient not taking: Reported on 01/01/2023) 30 tablet 0     Patient Stressors: Educational concerns   Marital or family conflict   Medication change or noncompliance    Patient Strengths: Arboriculturist fund of knowledge  Motivation for treatment/growth  Supportive family/friends   Treatment Modalities: Medication Management, Group therapy, Case management,  1 to 1 session with clinician, Psychoeducation, Recreational therapy.   Physician Treatment Plan for Primary Diagnosis: Severe recurrent major depression without psychotic features (HCC) Long Term Goal(s): Improvement in symptoms so as ready for discharge  Short Term Goals: Ability to identify changes in lifestyle to reduce recurrence of condition will improve Ability to verbalize feelings will improve Ability to disclose and discuss suicidal ideas Ability to demonstrate self-control will improve Ability to identify and develop effective coping behaviors will improve Ability to maintain clinical measurements within normal limits will improve Compliance with prescribed medications will improve Ability to identify triggers associated with substance abuse/mental health issues will improve  Medication Management: Evaluate patient's response, side effects, and tolerance of medication regimen.  Therapeutic Interventions: 1 to 1  sessions, Unit Group sessions and Medication administration.  Evaluation of Outcomes: Progressing  Physician Treatment Plan for Secondary Diagnosis: Principal Problem:   Severe recurrent major depression without psychotic features (HCC)  Long Term Goal(s): Improvement in symptoms so as ready for discharge   Short Term Goals: Ability to identify changes in lifestyle to reduce recurrence of condition will improve Ability to verbalize feelings will improve Ability to disclose and discuss suicidal ideas Ability to demonstrate self-control will improve Ability to identify and develop effective coping behaviors will improve Ability to maintain clinical measurements within normal limits will improve Compliance with prescribed medications will improve Ability to identify triggers associated with substance abuse/mental health issues will improve     Medication Management: Evaluate patient's response, side effects, and tolerance of medication regimen.  Therapeutic Interventions: 1 to 1 sessions, Unit Group sessions and Medication administration.  Evaluation of Outcomes: Progressing   RN Treatment Plan for Primary Diagnosis: Severe recurrent major depression without psychotic features (HCC) Long Term Goal(s): Knowledge of disease and therapeutic regimen to maintain health will improve  Short Term Goals: Ability to remain free from injury will improve, Ability to verbalize frustration and anger appropriately will improve, Ability to demonstrate self-control, Ability to participate in decision making will improve, Ability to verbalize feelings will improve, Ability to disclose and discuss suicidal ideas, Ability to identify and develop effective coping behaviors will improve, and Compliance with prescribed medications will improve  Medication Management: RN will administer medications as ordered by provider, will assess and evaluate patient's response and provide education to patient for prescribed  medication. RN will report any adverse and/or side effects to prescribing provider.  Therapeutic Interventions: 1 on 1 counseling sessions, Psychoeducation, Medication administration, Evaluate responses to treatment, Monitor vital signs and CBGs as ordered, Perform/monitor CIWA, COWS, AIMS and Fall Risk screenings as ordered, Perform wound care treatments as ordered.  Evaluation of Outcomes: Progressing   LCSW Treatment Plan for Primary Diagnosis: Severe recurrent major depression without psychotic features (HCC) Long Term Goal(s): Safe transition to appropriate next level of care at discharge, Engage patient in therapeutic group addressing interpersonal concerns.  Short Term Goals: Engage patient in aftercare planning with referrals and resources, Increase social support, Increase ability to appropriately verbalize feelings, Increase emotional regulation, Facilitate acceptance of mental health diagnosis and concerns, and Increase skills for wellness and recovery  Therapeutic Interventions: Assess for all discharge needs, 1 to 1 time with Social worker, Explore available resources and support systems, Assess for adequacy in community support network, Educate family and significant other(s) on suicide prevention, Complete Psychosocial Assessment, Interpersonal group therapy.  Evaluation of Outcomes: Progressing   Progress in Treatment: Attending groups: Yes. Participating in groups: Yes. Taking medication as prescribed: Yes. Toleration medication: Yes. Family/Significant other contact made: Yes, individual(s) contacted:  husband, Nichole Berry. Patient understands diagnosis: Yes. Discussing patient identified problems/goals with staff: Yes. Medical problems stabilized or resolved: Yes. Denies suicidal/homicidal ideation: Yes. Issues/concerns per patient self-inventory: No. Other:  none.  New problem(s) identified: No, Describe:  none identified.   New Short Term/Long Term Goal(s):  medication management for mood stabilization; elimination of SI thoughts; development of comprehensive mental wellness plan.   Patient Goals:  "One of those was how to stabilize my mood."  Discharge Plan or Barriers: CSW will assist pt with development of an appropriate aftercare/discharge plan.   Reason for Continuation of Hospitalization: Anxiety Depression Medication stabilization Suicidal ideation  Estimated Length of Stay: 1-7 days  Last 3 [place] Suicide Severity Risk Score: Flowsheet Row Admission (Current) from 01/01/2023 in [facility] INPATIENT BEHAV[facility]AL MEDICINE ED from 12/31/2022 in [facility] Emergency Department at [facility] ED to Hosp-Admission (Discharged) from 09/09/2021 in [facility]ERY  C-SSRS RISK CATEGORY No Risk High Risk No Risk       Last PHQ 2/9 Scores:     No data to display          Scribe for Treatment Team: Glenis Smoker, LCSW 01/04/2023 9:35 AM

## 2023-01-05 ENCOUNTER — Other Ambulatory Visit: Payer: Self-pay

## 2023-01-05 DIAGNOSIS — F332 Major depressive disorder, recurrent severe without psychotic features: Principal | ICD-10-CM

## 2023-01-05 NOTE — Plan of Care (Signed)
  Problem: Education: Goal: Knowledge of General Education information will improve Description: Including pain rating scale, medication(s)/side effects and non-pharmacologic comfort measures Outcome: Progressing   Problem: Nutrition: Goal: Adequate nutrition will be maintained Outcome: Progressing   Problem: Coping: Goal: Level of anxiety will decrease Outcome: Progressing   Problem: Safety: Goal: Ability to remain free from injury will improve Outcome: Progressing   Problem: Safety: Goal: Ability to remain free from injury will improve Outcome: Progressing

## 2023-01-05 NOTE — [facility] Suicide Risk Assessment (Signed)
[facility] Discharge Suicide Risk Assessment   Principal Problem: Severe recurrent major depression without psychotic features (HCC) Discharge Diagnoses: Principal Problem:   Severe recurrent major depression without psychotic features (HCC)   Total Time spent with patient: 30 minutes  Musculoskeletal: Strength & Muscle Tone: within normal limits Gait & Station: normal Patient leans: N/A  Psychiatric Specialty Exam  Presentation  General Appearance: No data recorded Eye Contact:No data recorded Speech:No data recorded Speech Volume:No data recorded Handedness:No data recorded  Mood and Affect  Mood:No data recorded Duration of Depression Symptoms: Greater than two weeks  Affect:No data recorded  Thought Process  Thought Processes:No data recorded Descriptions of Associations:No data recorded Orientation:No data recorded Thought Content:No data recorded History of Schizophrenia/Schizoaffective disorder:Yes  Duration of Psychotic Symptoms:Greater than six months  Hallucinations:No data recorded Ideas of Reference:No data recorded Suicidal Thoughts:No data recorded Homicidal Thoughts:No data recorded  Sensorium  Memory:No data recorded Judgment:No data recorded Insight:No data recorded  Executive Functions  Concentration:No data recorded Attention Span:No data recorded Recall:No data recorded Fund of Knowledge:No data recorded Language:No data recorded  Psychomotor Activity  Psychomotor Activity:No data recorded  Assets  Assets:No data recorded  Sleep  Sleep:No data recorded  Physical Exam: Physical Exam Vitals and nursing note reviewed.  Constitutional:      Appearance: Normal appearance.  HENT:     Head: Normocephalic and atraumatic.     Mouth/Throat:     Pharynx: Oropharynx is clear.  Eyes:     Pupils: Pupils are equal, round, and reactive to light.  Cardiovascular:     Rate and Rhythm: Normal rate and regular rhythm.  Pulmonary:     Effort:  Pulmonary effort is normal.     Breath sounds: Normal breath sounds.  Abdominal:     General: Abdomen is flat.     Palpations: Abdomen is soft.  Musculoskeletal:        General: Normal range of motion.  Skin:    General: Skin is warm and dry.  Neurological:     General: No focal deficit present.     Mental Status: She is alert. Mental status is at baseline.  Psychiatric:        Attention and Perception: Attention normal.        Mood and Affect: Mood normal.        Speech: Speech normal.        Behavior: Behavior is cooperative.        Thought Content: Thought content normal.        Cognition and Memory: Cognition normal.        Judgment: Judgment normal.    Review of Systems  Constitutional: Negative.   HENT: Negative.    Eyes: Negative.   Respiratory: Negative.    Cardiovascular: Negative.   Gastrointestinal: Negative.   Musculoskeletal: Negative.   Skin: Negative.   Neurological: Negative.   Psychiatric/Behavioral: Negative.     Blood pressure 124/70, pulse 93, temperature 98.3 F (36.8 C), temperature source Oral, resp. rate 19, height 5\' 6"  (1.676 m), weight (!) 159.2 kg, SpO2 100 %. Body mass index is 56.65 kg/m.  Mental Status Per Nursing Assessment::   On Admission:  NA  Demographic Factors:  NA  Loss Factors: Financial problems/change in socioeconomic status  Historical Factors: Impulsivity  Risk Reduction Factors:   Living with another person, especially a relative, Positive social support, and Positive therapeutic relationship  Continued Clinical Symptoms:  Depression:   Comorbid alcohol abuse/dependence  Cognitive Features That Contribute To Risk:  None    Suicide Risk:  Minimal: No identifiable suicidal ideation.  Patients presenting with no risk factors but with morbid ruminations; may be classified as minimal risk based on the severity of the depressive symptoms    Plan Of Care/Follow-up recommendations:  Activity:  Patient will be  discharged today.  She is to follow-up with her outpatient mental health providers and engage in therapy and medication management.  Prescriptions and supply of medicine provided.  Patient is upbeat and her affect denies any suicidal ideation and does not appear at this point to be acutely dangerous.  Nichole Rasmussen, MD 01/05/2023, 9:07 AM

## 2023-01-05 NOTE — Progress Notes (Signed)
  [facility] Adult Case Management Discharge Plan :  Will you be returning to the same living situation after discharge:  Yes,  pt plans to return home upon discharge. At discharge, do you have transportation home?: Yes,  pt support system to provide transportation. Do you have the ability to pay for your medications: Yes,  [employer].  Release of information consent forms completed and in the chart;  Patient's signature needed at discharge.  Patient to Follow up at:  Follow-up Information     [facility] Follow up.   Why: You will be meeting Lorella Nimrod, peer support specialist, at [facility] on Wednesday, 01/13/23 at 7:30AM. Thanks! Contact information: [address] [place] [address] [phone]                 Next level of care provider has access to [facility] Link:no  Safety Planning and Suicide Prevention discussed: Yes,  SPE completed with husband, Ned Grace.     Has patient been referred to the Quitline?: Patient refused referral for treatment  Patient has been referred for addiction treatment: No known substance use disorder.  Glenis Smoker, LCSW 01/05/2023, 9:12 AM

## 2023-01-05 NOTE — Progress Notes (Signed)
Patient presents with bright affect. Smiling and laughing, states she was able to speak with her baby and this was the first time baby said mama. Denies SI, HI, AVH. No complaints or concerns voiced. Medications given as prescribed. Visible in milieu, washing clothes.  Encouragement and support provided. Safety checks maintained. Medications given as prescribed. Pt receptive and remains safe on unit with q 15 min checks.

## 2023-01-05 NOTE — Discharge Summary (Signed)
Physician Discharge Summary Note  Patient:  Nichole Berry is an 37 y.o., female MRN:  [id number] DOB:  September 18, 1985 Patient phone:  [phone] (home)  Patient address:   [address] [place] [address],  Total Time spent with patient: 30 minutes  Date of Admission:  01/01/2023 Date of Discharge: 01/05/2023  Reason for Admission: Patient was admitted because of overdose on prescription medication in the context of alcohol use and depression  Principal Problem: Severe recurrent major depression without psychotic features [facility]) Discharge Diagnoses: Principal Problem:   Severe recurrent major depression without psychotic features [facility])   Past Psychiatric History: Past history of longstanding mood instability with depressive symptoms and mood swings  Past Medical History:  Past Medical History:  Diagnosis Date   Anemia    Hidradenitis    Strep throat    FINISHED ANTIBIOTICS ON 09-12-18    Past Surgical History:  Procedure Laterality Date   CESAREAN SECTION  2006, 2013   x2   CESAREAN SECTION N/A 07/05/2018   Procedure: REPEAT CESAREAN SECTION;  Surgeon: Nadara Mustard, MD;  Location: [facility] ORS;  Service: Obstetrics;  Laterality: N/A;  Female born @ [birth year] Apgars: 9/9 Weight: 6lbs 2 ozs   DILATATION & CURETTAGE/HYSTEROSCOPY WITH MYOSURE N/A 09/09/2021   Procedure: DILATATION & CURETTAGE/HYSTEROSCOPY WITH MYOSURE AND PLACEMENT OF UTERINE TAMPONADE BALLOON;  Surgeon: Christeen Douglas, MD;  Location: [facility] ORS;  Service: Gynecology;  Laterality: N/A;   HYDRADENITIS EXCISION Right 09/14/2018   Procedure: EXCISION HIDRADENITIS AXILLA - RIGHT;  Surgeon: Duanne Guess, MD;  Location: [facility] ORS;  Service: General;  Laterality: Right;   Family History:  Family History  Problem Relation Age of Onset   Hypertension Mother    Diabetes Mother    Kidney disease Father    Cancer Maternal Grandmother    Diabetes Maternal Grandmother    Hypertension Maternal Grandmother    Pancreatic  cancer Maternal Grandmother    Cancer Maternal Grandfather    Diabetes Maternal Grandfather    Hypertension Maternal Grandfather    Pancreatic cancer Maternal Grandfather    Colon cancer Neg Hx    Breast cancer Neg Hx    Family Psychiatric  History: See previous Social History:  Social History   Substance and Sexual Activity  Alcohol Use No     Social History   Substance and Sexual Activity  Drug Use No    Social History   Socioeconomic History   Marital status: Married    Spouse name: Warden/ranger   Number of children: Not on file   Years of education: Not on file   Highest education level: Not on file  Occupational History   Occupation: Unemployed  Tobacco Use   Smoking status: Former    Packs/day: 0.50    Years: 14.00    Additional pack years: 0.00    Total pack years: 7.00    Types: Cigarettes    Quit date: 09/17/2017    Years since quitting: 5.3   Smokeless tobacco: Never  Vaping Use   Vaping Use: Never used  Substance and Sexual Activity   Alcohol use: No   Drug use: No   Sexual activity: Not Currently    Birth control/protection: Injection  Other Topics Concern   Not on file  Social History Narrative   Not on file   Social Determinants of Health   Financial Resource Strain: Not on file  Food Insecurity: No Food Insecurity (01/01/2023)   Hunger Vital Sign    Worried About Running Out  of Food in the Last Year: Never true    Ran Out of Food in the Last Year: Never true  Transportation Needs: No Transportation Needs (01/01/2023)   PRAPARE - Administrator, Civil Service (Medical): No    Lack of Transportation (Non-Medical): No  Physical Activity: Not on file  Stress: Not on file  Social Connections: Not on file    Hospital Course: Admitted to psychiatric hospital.  15-minute checks continued.  Patient did not show any signs of alcohol withdrawal and did not need specific treatment for it.  Patient's medications were reviewed and some adjustments  were made by the intake physician.  Patient states she is feeling better.  Affect has been upbeat here in the hospital.  She has been lucid and appropriate in her discussion of her needs.  She is agreeing to follow-up medication management and therapy.  Consistently denies suicidal ideation and no longer appears to meet commitment criteria.  Patient will be discharged with recommendations for outpatient follow-up.  Physical Findings: AIMS: Facial and Oral Movements Muscles of Facial Expression: None, normal Lips and Perioral Area: None, normal Jaw: None, normal Tongue: None, normal,Extremity Movements Upper (arms, wrists, hands, fingers): None, normal Lower (legs, knees, ankles, toes): None, normal, Trunk Movements Neck, shoulders, hips: None, normal, Overall Severity Severity of abnormal movements (highest score from questions above): None, normal Incapacitation due to abnormal movements: None, normal Patient's awareness of abnormal movements (rate only patient's report): No Awareness, Dental Status Current problems with teeth and/or dentures?: No Does patient usually wear dentures?: No  CIWA:    COWS:     Musculoskeletal: Strength & Muscle Tone: within normal limits Gait & Station: normal Patient leans: N/A   Psychiatric Specialty Exam:  Presentation  General Appearance: No data recorded Eye Contact:No data recorded Speech:No data recorded Speech Volume:No data recorded Handedness:No data recorded  Mood and Affect  Mood:No data recorded Affect:No data recorded  Thought Process  Thought Processes:No data recorded Descriptions of Associations:No data recorded Orientation:No data recorded Thought Content:No data recorded History of Schizophrenia/Schizoaffective disorder:Yes  Duration of Psychotic Symptoms:Greater than six months  Hallucinations:No data recorded Ideas of Reference:No data recorded Suicidal Thoughts:No data recorded Homicidal Thoughts:No data  recorded  Sensorium  Memory:No data recorded Judgment:No data recorded Insight:No data recorded  Executive Functions  Concentration:No data recorded Attention Span:No data recorded Recall:No data recorded Fund of Knowledge:No data recorded Language:No data recorded  Psychomotor Activity  Psychomotor Activity:No data recorded  Assets  Assets:No data recorded  Sleep  Sleep:No data recorded   Physical Exam: Physical Exam Vitals and nursing note reviewed.  Constitutional:      Appearance: Normal appearance.  HENT:     Head: Normocephalic and atraumatic.     Mouth/Throat:     Pharynx: Oropharynx is clear.  Eyes:     Pupils: Pupils are equal, round, and reactive to light.  Cardiovascular:     Rate and Rhythm: Normal rate and regular rhythm.  Pulmonary:     Effort: Pulmonary effort is normal.     Breath sounds: Normal breath sounds.  Abdominal:     General: Abdomen is flat.     Palpations: Abdomen is soft.  Musculoskeletal:        General: Normal range of motion.  Skin:    General: Skin is warm and dry.  Neurological:     General: No focal deficit present.     Mental Status: She is alert. Mental status is at baseline.  Psychiatric:  Attention and Perception: Attention normal.        Mood and Affect: Mood normal.        Speech: Speech normal.        Behavior: Behavior normal.        Thought Content: Thought content normal.        Cognition and Memory: Cognition normal.        Judgment: Judgment normal.    Review of Systems  Constitutional: Negative.   HENT: Negative.    Eyes: Negative.   Respiratory: Negative.    Cardiovascular: Negative.   Gastrointestinal: Negative.   Musculoskeletal: Negative.   Skin: Negative.   Neurological: Negative.   Psychiatric/Behavioral: Negative.     Blood pressure 124/70, pulse 93, temperature 98.3 F (36.8 C), temperature source Oral, resp. rate 19, height 5\' 6"  (1.676 m), weight (!) 159.2 kg, SpO2 100 %. Body mass  index is 56.65 kg/m.   Social History   Tobacco Use  Smoking Status Former   Packs/day: 0.50   Years: 14.00   Additional pack years: 0.00   Total pack years: 7.00   Types: Cigarettes   Quit date: 09/17/2017   Years since quitting: 5.3  Smokeless Tobacco Never   Tobacco Cessation:  A prescription for an FDA-approved tobacco cessation medication provided at discharge   Blood Alcohol level:  Lab Results  Component Value Date   ETH 94 (H) 01/01/2023    Metabolic Disorder Labs:  No results found for: "HGBA1C", "MPG" No results found for: "PROLACTIN" No results found for: "CHOL", "TRIG", "HDL", "CHOLHDL", "VLDL", "LDLCALC"  See Psychiatric Specialty Exam and Suicide Risk Assessment completed by Attending Physician prior to discharge.  Discharge destination:  Home  Is patient on multiple antipsychotic therapies at discharge:  No   Has Patient had three or more failed trials of antipsychotic monotherapy by history:  No  Recommended Plan for Multiple Antipsychotic Therapies: NA  Discharge Instructions     Diet - low sodium heart healthy   Complete by: As directed    Increase activity slowly   Complete by: As directed       Allergies as of 01/05/2023       Reactions   Latex Hives        Medication List     STOP taking these medications    docusate sodium 100 MG capsule Commonly known as: COLACE   ibuprofen 800 MG tablet Commonly known as: ADVIL       TAKE these medications      Indication  FLUoxetine 20 MG capsule Commonly known as: PROZAC Take 1 capsule (20 mg total) by mouth daily.  Indication: Depression   lurasidone 40 MG Tabs tablet Commonly known as: LATUDA Take 1 tablet (40 mg total) by mouth at bedtime.  Indication: MIXED BIPOLAR AFFECTIVE DISORDER   nicotine 21 mg/24hr patch Commonly known as: NICODERM CQ - dosed in mg/24 hours Place 1 patch (21 mg total) onto the skin daily.  Indication: Nicotine Addiction   nicotine polacrilex 2  MG gum Commonly known as: NICORETTE Take 1 each (2 mg total) by mouth as needed for smoking cessation.  Indication: Nicotine Addiction   sulfamethoxazole-trimethoprim 800-160 MG tablet Commonly known as: BACTRIM DS Take 1 tablet by mouth every 12 (twelve) hours.  Indication: Urinary Tract Infection   thiamine 100 MG tablet Commonly known as: Vitamin B-1 Take 1 tablet (100 mg total) by mouth daily.  Indication: Deficiency of Vitamin B1   topiramate 25 MG tablet Commonly known as:  TOPAMAX Take 1 tablet (25 mg total) by mouth 2 (two) times daily at 8 am and 4 pm.  Indication: Abuse or Misuse of Alcohol   traZODone 100 MG tablet Commonly known as: DESYREL Take 1 tablet (100 mg total) by mouth at bedtime.  Indication: Trouble Sleeping         Follow-up recommendations:  Other:  Recommend follow-up therapy and medication management and avoidance of all alcohol consumption.  Comments: Prescriptions provided  Signed: Mordecai Rasmussen, MD 01/05/2023, 9:10 AM

## 2023-01-05 NOTE — Progress Notes (Signed)
Discharge Note:  Patient denies SI/HI/AVH at this time. Discharge instructions, AVS, prescriptions, and transition record gone over with patient. Patient agrees to comply with medication management, follow-up visit, and outpatient therapy. Patient belongings returned to patient. No questions or concerns at present.. Patient ambulatory off unit. Patient discharged to home with her husband.

## 2023-01-05 NOTE — Plan of Care (Signed)
  Problem: Education: Goal: Knowledge of General Education information will improve Description: Including pain rating scale, medication(s)/side effects and non-pharmacologic comfort measures Outcome: Adequate for Discharge   Problem: Health Behavior/Discharge Planning: Goal: Ability to manage health-related needs will improve Outcome: Adequate for Discharge   Problem: Clinical Measurements: Goal: Ability to maintain clinical measurements within normal limits will improve Outcome: Adequate for Discharge Goal: Will remain free from infection Outcome: Adequate for Discharge Goal: Diagnostic test results will improve Outcome: Adequate for Discharge Goal: Respiratory complications will improve Outcome: Adequate for Discharge Goal: Cardiovascular complication will be avoided Outcome: Adequate for Discharge   Problem: Activity: Goal: Risk for activity intolerance will decrease Outcome: Adequate for Discharge   Problem: Nutrition: Goal: Adequate nutrition will be maintained Outcome: Adequate for Discharge   Problem: Coping: Goal: Level of anxiety will decrease Outcome: Adequate for Discharge   Problem: Elimination: Goal: Will not experience complications related to bowel motility Outcome: Adequate for Discharge Goal: Will not experience complications related to urinary retention Outcome: Adequate for Discharge   Problem: Pain Managment: Goal: General experience of comfort will improve Outcome: Adequate for Discharge   Problem: Safety: Goal: Ability to remain free from injury will improve Outcome: Adequate for Discharge   Problem: Skin Integrity: Goal: Risk for impaired skin integrity will decrease Outcome: Adequate for Discharge   Problem: Physical Regulation: Goal: Complications related to the disease process, condition or treatment will be avoided or minimized Outcome: Adequate for Discharge   Problem: Safety: Goal: Ability to remain free from injury will  improve Outcome: Adequate for Discharge   Problem: Education: Goal: Knowledge of [place] [facility] Education information/materials will improve Outcome: Adequate for Discharge Goal: Emotional status will improve Outcome: Adequate for Discharge Goal: Mental status will improve Outcome: Adequate for Discharge Goal: Verbalization of understanding the information provided will improve Outcome: Adequate for Discharge   Problem: Safety: Goal: Periods of time without injury will increase Outcome: Adequate for Discharge   Problem: Self-Concept: Goal: Ability to disclose and discuss suicidal ideas will improve Outcome: Adequate for Discharge Goal: Will verbalize positive feelings about self Outcome: Adequate for Discharge

## 2023-09-18 ENCOUNTER — Emergency Department: Payer: MEDICAID

## 2023-09-18 ENCOUNTER — Emergency Department
Admission: EM | Admit: 2023-09-18 | Discharge: 2023-09-18 | Disposition: A | Discharge status: Home / Self Care | Payer: MEDICAID | Attending: Emergency Medicine | Admitting: Emergency Medicine

## 2023-09-18 ENCOUNTER — Other Ambulatory Visit: Payer: Self-pay

## 2023-09-18 DIAGNOSIS — K439 Ventral hernia without obstruction or gangrene: Secondary | ICD-10-CM | POA: Diagnosis not present

## 2023-09-18 DIAGNOSIS — R1030 Lower abdominal pain, unspecified: Secondary | ICD-10-CM | POA: Diagnosis present

## 2023-09-18 DIAGNOSIS — K432 Incisional hernia without obstruction or gangrene: Secondary | ICD-10-CM

## 2023-09-18 LAB — CBC WITH DIFFERENTIAL/PLATELET
Abs Immature Granulocytes: 0.03 10*3/uL (ref 0.00–0.07)
Basophils Absolute: 0 10*3/uL (ref 0.0–0.1)
Basophils Relative: 0 %
Eosinophils Absolute: 0.2 10*3/uL (ref 0.0–0.5)
Eosinophils Relative: 2 %
HCT: 34.9 % — ABNORMAL LOW (ref 36.0–46.0)
Hemoglobin: 11.3 g/dL — ABNORMAL LOW (ref 12.0–15.0)
Immature Granulocytes: 0 %
Lymphocytes Relative: 35 %
Lymphs Abs: 3.4 10*3/uL (ref 0.7–4.0)
MCH: 23.3 pg — ABNORMAL LOW (ref 26.0–34.0)
MCHC: 32.4 g/dL (ref 30.0–36.0)
MCV: 72 fL — ABNORMAL LOW (ref 80.0–100.0)
Monocytes Absolute: 0.6 10*3/uL (ref 0.1–1.0)
Monocytes Relative: 7 %
Neutro Abs: 5.5 10*3/uL (ref 1.7–7.7)
Neutrophils Relative %: 56 %
Platelets: 331 10*3/uL (ref 150–400)
RBC: 4.85 MIL/uL (ref 3.87–5.11)
RDW: 16.1 % — ABNORMAL HIGH (ref 11.5–15.5)
WBC: 9.8 10*3/uL (ref 4.0–10.5)
nRBC: 0 % (ref 0.0–0.2)

## 2023-09-18 LAB — URINALYSIS, ROUTINE W REFLEX MICROSCOPIC
Bacteria, UA: NONE SEEN
Bilirubin Urine: NEGATIVE
Glucose, UA: NEGATIVE mg/dL
Hgb urine dipstick: NEGATIVE
Ketones, ur: NEGATIVE mg/dL
Nitrite: NEGATIVE
Protein, ur: NEGATIVE mg/dL
Specific Gravity, Urine: 1.027 (ref 1.005–1.030)
pH: 5 (ref 5.0–8.0)

## 2023-09-18 LAB — POC URINE PREG, ED: Preg Test, Ur: NEGATIVE

## 2023-09-18 LAB — BASIC METABOLIC PANEL
Anion gap: 11 (ref 5–15)
BUN: 14 mg/dL (ref 6–20)
CO2: 23 mmol/L (ref 22–32)
Calcium: 9.1 mg/dL (ref 8.9–10.3)
Chloride: 103 mmol/L (ref 98–111)
Creatinine, Ser: 0.78 mg/dL (ref 0.44–1.00)
GFR, Estimated: >60 mL/min (ref >60)
Glucose, Bld: 162 mg/dL — ABNORMAL HIGH (ref 70–99)
Potassium: 3.8 mmol/L (ref 3.5–5.1)
Sodium: 137 mmol/L (ref 135–145)

## 2023-09-18 MED ORDER — ONDANSETRON 4 MG PO TBDP
4.0000 mg | ORAL_TABLET | Freq: Once | ORAL | Status: AC
  Administered 2023-09-18: 4 mg via ORAL
  Filled 2023-09-18: qty 1

## 2023-09-18 MED ORDER — ACETAMINOPHEN 500 MG PO TABS
1000.0000 mg | ORAL_TABLET | Freq: Once | ORAL | Status: AC
  Administered 2023-09-18: 1000 mg via ORAL
  Filled 2023-09-18: qty 2

## 2023-09-18 MED ORDER — OXYCODONE HCL 5 MG PO TABS
5.0000 mg | ORAL_TABLET | Freq: Once | ORAL | Status: AC
  Administered 2023-09-18: 5 mg via ORAL
  Filled 2023-09-18: qty 1

## 2023-09-18 MED ORDER — OXYCODONE HCL 5 MG PO TABS: 5.0000 mg | ORAL_TABLET | Freq: Three times a day (TID) | ORAL | 0 refills | Status: DC | PRN

## 2023-09-18 MED ORDER — ONDANSETRON 4 MG PO TBDP: 4.0000 mg | ORAL_TABLET | Freq: Three times a day (TID) | ORAL | 0 refills | Status: AC | PRN

## 2023-09-18 MED ORDER — KETOROLAC TROMETHAMINE 30 MG/ML IJ SOLN
30.0000 mg | Freq: Once | INTRAMUSCULAR | Status: AC
  Administered 2023-09-18: 30 mg via INTRAMUSCULAR
  Filled 2023-09-18: qty 1

## 2023-09-18 MED ORDER — DICYCLOMINE HCL 10 MG PO CAPS
10.0000 mg | ORAL_CAPSULE | Freq: Once | ORAL | Status: AC
  Administered 2023-09-18: 10 mg via ORAL
  Filled 2023-09-18: qty 1

## 2023-09-18 NOTE — Discharge Instructions (Signed)
Please take Tylenol and ibuprofen/Advil for your pain.  It is safe to take them together, or to alternate them every few hours.  Take up to 1000mg  of Tylenol at a time, up to 4 times per day.  Do not take more than 4000 mg of Tylenol in 24 hours.  For ibuprofen, take 400-600 mg, 3 - 4 times per day.  Zofran as needed for nausea and vomiting.  Oxycodone as needed for more severe/breakthrough pain.   Please follow-up with the surgeon in the clinic to discuss an elective hernia repair.  If your symptoms worsen despite the above medications, please return to the ED

## 2023-09-18 NOTE — ED Provider Notes (Signed)
[facility] Provider Note    Event Date/Time   First MD Initiated Contact with Patient 09/18/23 [phone]     (approximate)   History   No chief complaint on file.   HPI  Nichole Berry is a 38 y.o. female who presents to the ED for evaluation of No chief complaint on file.   Review of some remote obstetric visits from 2022.  Cesarean section x 2, subsequent D&C 2023.  Patient self-reports 4 cesarean sections in the past, no other intra-abdominal surgeries.  She presents to the ED for evaluation of chronic lower abdominal pain for the past 2 years since the most recent cesarean section.  She reports she often has pain.  Often has pain with her menstrual periods.  Reports the pain is of a typical nature as it has been for multiple years, but of a more severe intensity.  Reports it feels like gas bubbles.   Physical Exam   Triage Vital Signs: ED Triage Vitals  Encounter Vitals Group     BP 09/18/23 0323 (!) 151/95     Systolic BP Percentile --      Diastolic BP Percentile --      Pulse Rate 09/18/23 0323 (!) 108     Resp 09/18/23 0323 18     Temp 09/18/23 0323 98.9 F (37.2 C)     Temp Source 09/18/23 0323 Oral     SpO2 09/18/23 0323 95 %     Weight 09/18/23 0324 (!) 340 lb (154.2 kg)     Height 09/18/23 0324 5\' 6"  (1.676 m)     Head Circumference --      Peak Flow --      Pain Score 09/18/23 0606 Asleep     Pain Loc --      Pain Education --      Exclude from Growth Chart --     Most recent vital signs: Vitals:   09/18/23 0323 09/18/23 0800  BP: (!) 151/95 (!) 123/93  Pulse: (!) 108 82  Resp: 18 18  Temp: 98.9 F (37.2 C) 98 F (36.7 C)  SpO2: 95% 100%    General: Awake, no distress.  Morbidly obese and tearful. CV:  Good peripheral perfusion.  Resp:  Normal effort.  Abd:  No distention.  Poorly localizing lower abdominal tenderness without peritoneal features.  Upper abdomen is benign. MSK:  No deformity noted.  Neuro:  No focal  deficits appreciated. Other:     ED Results / Procedures / Treatments   Labs (all labs ordered are listed, but only abnormal results are displayed) Labs Reviewed  CBC WITH DIFFERENTIAL/PLATELET - Abnormal; Notable for the following components:      Result Value   Hemoglobin 11.3 (*)    HCT 34.9 (*)    MCV 72.0 (*)    MCH 23.3 (*)    RDW 16.1 (*)    All other components within normal limits  BASIC METABOLIC PANEL - Abnormal; Notable for the following components:   Glucose, Bld 162 (*)    All other components within normal limits  URINALYSIS, ROUTINE W REFLEX MICROSCOPIC - Abnormal; Notable for the following components:   Color, Urine YELLOW (*)    APPearance HAZY (*)    Leukocytes,Ua LARGE (*)    All other components within normal limits  POC URINE PREG, ED    EKG   RADIOLOGY CT abdomen/pelvis interpreted by me with a fairly large ventral hernia without obstructive features.  Official radiology report(s):  CT ABDOMEN PELVIS WO CONTRAST Result Date: 09/18/2023 CLINICAL DATA:  chronic lower abd pain after C section 2 years ago. eval uterine fibroids, hernia, appy, sbo EXAM: CT ABDOMEN AND PELVIS WITHOUT CONTRAST TECHNIQUE: Multidetector CT imaging of the abdomen and pelvis was performed following the standard protocol without IV contrast. RADIATION DOSE REDUCTION: This exam was performed according to the departmental dose-optimization program which includes automated exposure control, adjustment of the mA and/or kV according to patient size and/or use of iterative reconstruction technique. COMPARISON:  None Available. FINDINGS: Lower chest: No pleural or pericardial effusion. Visualized lung bases clear. Hepatobiliary: No focal liver abnormality is seen. No gallstones, gallbladder wall thickening, or biliary dilatation. Pancreas: Unremarkable. No pancreatic ductal dilatation or surrounding inflammatory changes. Spleen: Normal in size without focal abnormality. Adrenals/Urinary Tract:  Adrenal glands are unremarkable. Kidneys are normal, without renal calculi, focal lesion, or hydronephrosis. Bladder is unremarkable. Stomach/Bowel: Stomach incompletely distended. Small bowel decompressed. Loops of small bowel protrude into a moderately large bilobed midline anterior pelvic wall hernia, without evidence of obstruction or strangulation. Normal appendix. The colon is partially distended, without acute finding. Vascular/Lymphatic: No significant vascular findings are present. No enlarged abdominal or pelvic lymph nodes. Reproductive: Uterus and bilateral adnexa are unremarkable. Other: No ascites.  Bilateral pelvic phleboliths.  No free air. Musculoskeletal: Small paraumbilical hernia containing only mesenteric fat. Moderately large anterior pelvic ventral hernia involving loops of small bowel without obstruction or strangulation. Mild facet DJD L4-S1. Bilateral nonerosive sacroiliitis. IMPRESSION: 1. No acute findings. 2. Moderately large pelvic ventral hernia containing loops of small bowel without obstruction or strangulation. 3. Small paraumbilical hernia containing only mesenteric fat. Electronically Signed   By: Corlis Leak M.D.   On: 09/18/2023 08:38    PROCEDURES and INTERVENTIONS:  Procedures  Medications  oxyCODONE (Oxy IR/ROXICODONE) immediate release tablet 5 mg (has no administration in time range)  acetaminophen (TYLENOL) tablet 1,000 mg (1,000 mg Oral Given 09/18/23 0526)  ketorolac (TORADOL) 30 MG/ML injection 30 mg (30 mg Intramuscular Given 09/18/23 0742)  ondansetron (ZOFRAN-ODT) disintegrating tablet 4 mg (4 mg Oral Given 09/18/23 0742)  dicyclomine (BENTYL) capsule 10 mg (10 mg Oral Given 09/18/23 0742)     IMPRESSION / MDM / ASSESSMENT AND PLAN / ED COURSE  I reviewed the triage vital signs and the nursing notes.  Differential diagnosis includes, but is not limited to, IBS, ventral or inguinal hernia, symptomatic fibroids, appendicitis, acute cystitis   {Patient  presents with symptoms of an acute illness or injury that is potentially life-threatening.  Patient presents with chronic intermittent lower abdominal pain and evidence of a ventral hernia suitable for outpatient surgical follow-up.  Normal WBC, mild hyperglycemia without acidosis or other metabolic derangements.  Some leukocytes in the urine but no bacteria and no urinary symptoms.  CT, as above.  Pain is controlled, no obstructive features and she is suitable for outpatient surgical follow-up.  Discussed ED return precautions.  Discharged with analgesia and antiemetics.  Clinical Course as of 09/18/23 0919  Sat Sep 18, 2023  0902 Reassessed.  Tachycardia is resolved patient reports feeling somewhat better we discussed CT results, hernia.  We discussed outpatient surgical follow-up, pain control and ED return precautions.  She is agreeable. [DS]    Clinical Course User Index [DS] Delton Prairie, MD     FINAL CLINICAL IMPRESSION(S) / ED DIAGNOSES   Final diagnoses:  Recurrent ventral hernia     Rx / DC Orders   ED Discharge Orders  Ordered    ondansetron (ZOFRAN-ODT) 4 MG disintegrating tablet  Every 8 hours PRN        09/18/23 0903    oxyCODONE (ROXICODONE) 5 MG immediate release tablet  Every 8 hours PRN        09/18/23 1610             Note:  This document was prepared using Dragon voice recognition software and may include unintentional dictation errors.   Delton Prairie, MD 09/18/23 [phone]

## 2023-09-18 NOTE — ED Notes (Signed)
Pt tearful at bedside upon shift assessment. Pt c/o 10/10 pain in abd. Medications given at this time. Pt up to the bathroom to provide urine sample.

## 2023-09-18 NOTE — ED Triage Notes (Signed)
Patient C/O abdominal/pelvic mass that she discovered two years ago and has recently become more painful. Patient has not been previously evaluated for the mass. She states that when she starts her period, the mass becomes more visible and harder.

## 2023-10-05 ENCOUNTER — Encounter: Payer: Self-pay | Admitting: Surgery

## 2023-10-05 ENCOUNTER — Ambulatory Visit (INDEPENDENT_AMBULATORY_CARE_PROVIDER_SITE_OTHER): Payer: MEDICAID | Admitting: Surgery

## 2023-10-05 VITALS — BP 131/87 | HR 93 | Temp 98.3°F | Ht 66.0 in | Wt 350.8 lb

## 2023-10-05 DIAGNOSIS — E66813 Obesity, class 3: Secondary | ICD-10-CM

## 2023-10-05 DIAGNOSIS — Z6841 Body Mass Index (BMI) 40.0 and over, adult: Secondary | ICD-10-CM

## 2023-10-05 DIAGNOSIS — K432 Incisional hernia without obstruction or gangrene: Secondary | ICD-10-CM | POA: Insufficient documentation

## 2023-10-05 IMAGING — US US PELVIS COMPLETE WITH TRANSVAGINAL
1 series · 13 of 25 positions shown · non-contrast
Comparison: No priors.

CLINICAL DATA: 35-year-old female with history of persistent
vaginal bleeding following C-section 6 weeks ago. Evaluate for
retained products of conception.

EXAM:
TRANSABDOMINAL AND TRANSVAGINAL ULTRASOUND OF PELVIS
DOPPLER ULTRASOUND OF OVARIES
TECHNIQUE: Both transabdominal and transvaginal ultrasound examinations of the
pelvis were performed. Transabdominal technique was performed for
global imaging of the pelvis including uterus, ovaries, adnexal
regions, and pelvic cul-de-sac.
It was necessary to proceed with endovaginal exam following the
transabdominal exam to visualize the ovaries. Color and duplex
Doppler ultrasound was utilized to evaluate blood flow to the
ovaries.

[Series 1: us pelvic complete with transvaginal · 102 acquisitions, 13 frames shown]
[im 1/102]
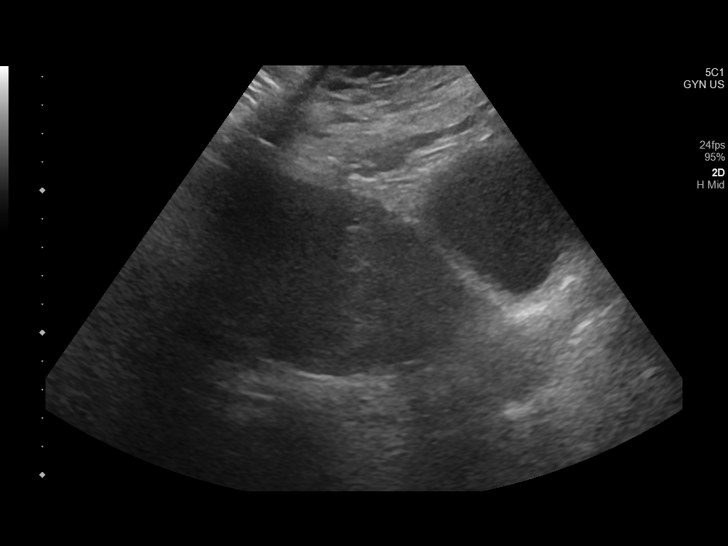
[im 9/102]
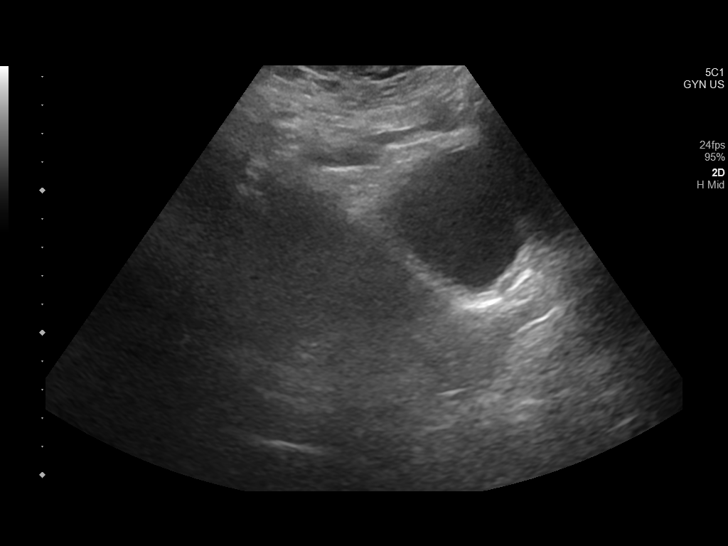
[im 17/102]
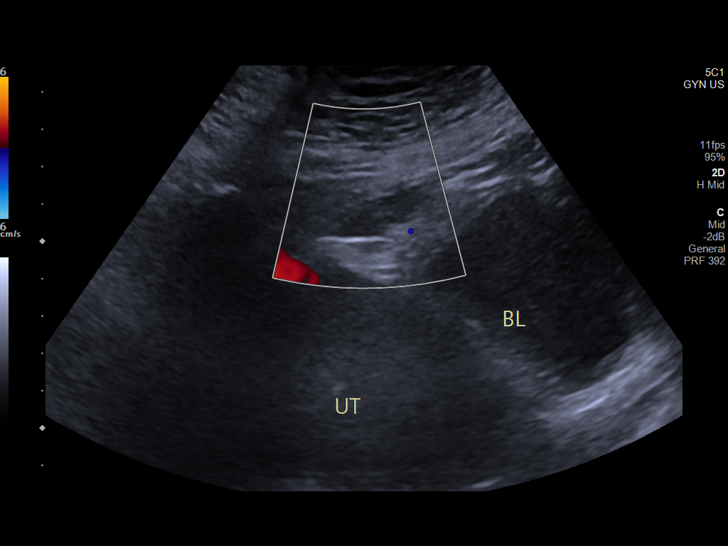
[im 26/102]
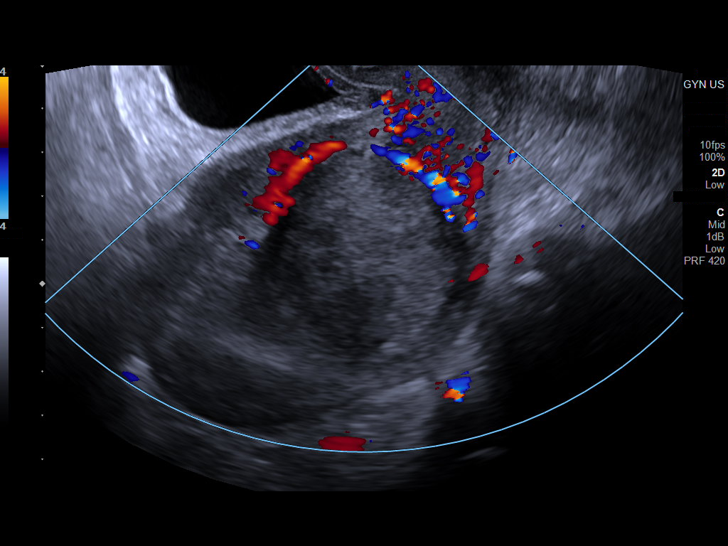
[im 34/102]
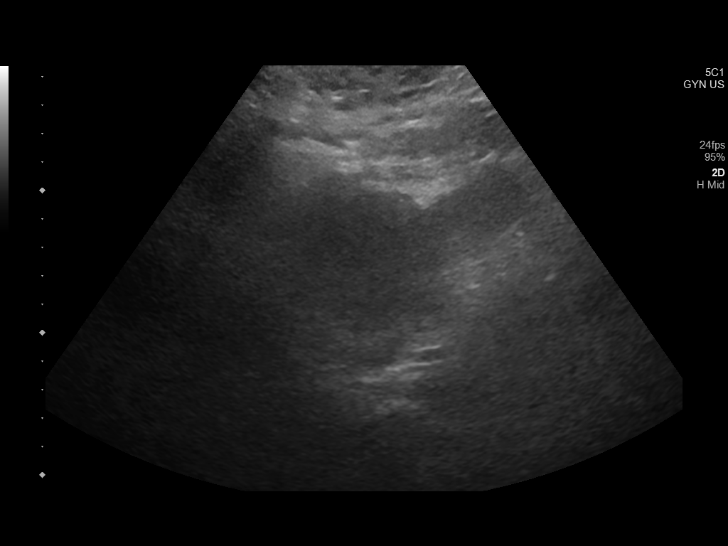
[im 43/102]
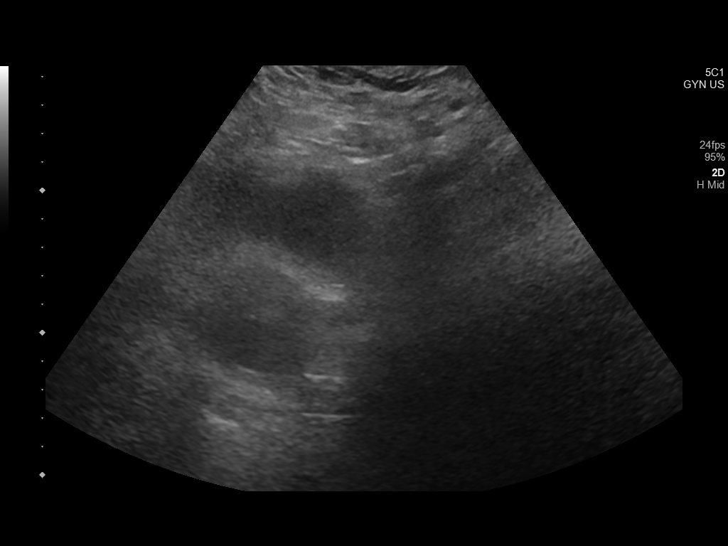
[im 51/102]
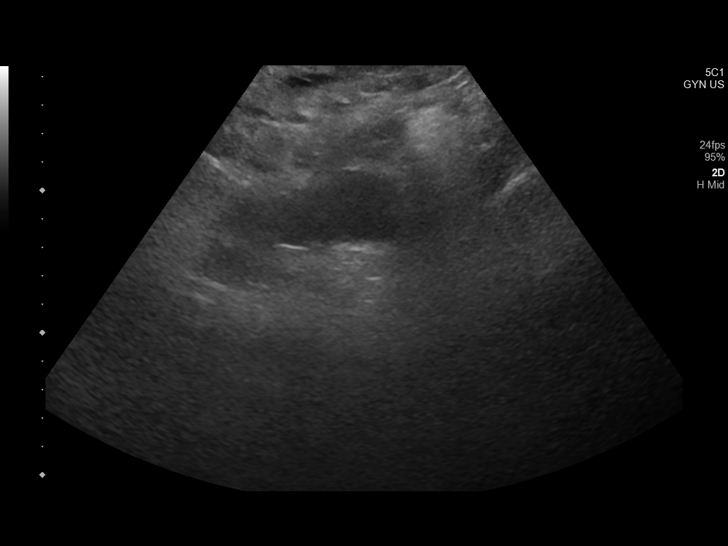
[im 59/102]
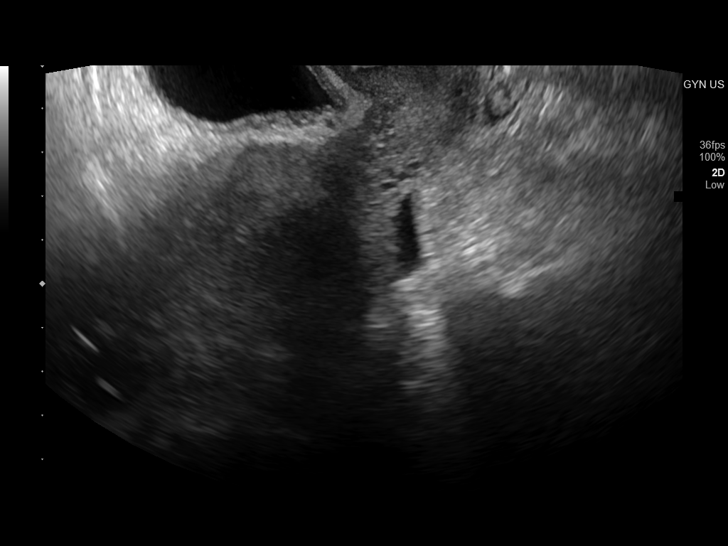
[im 68/102]
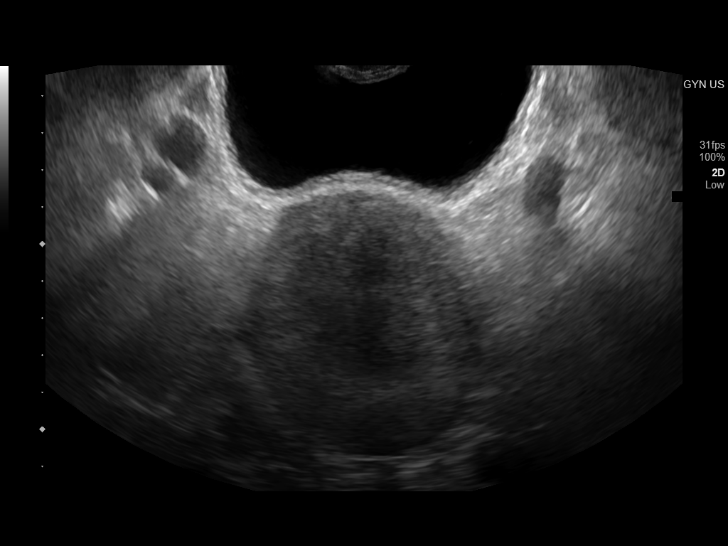
[im 76/102]
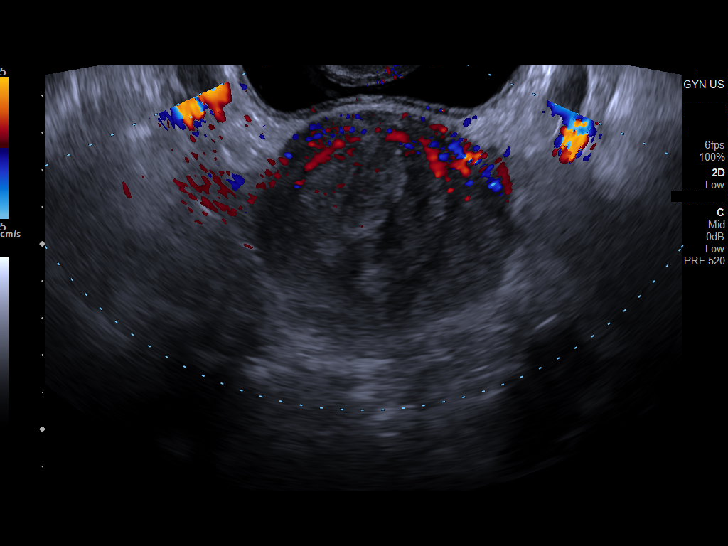
[im 85/102]
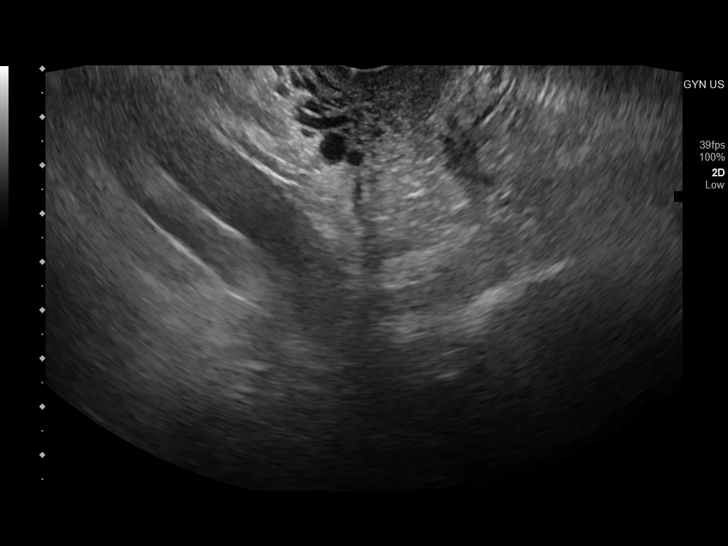
[im 93/102]
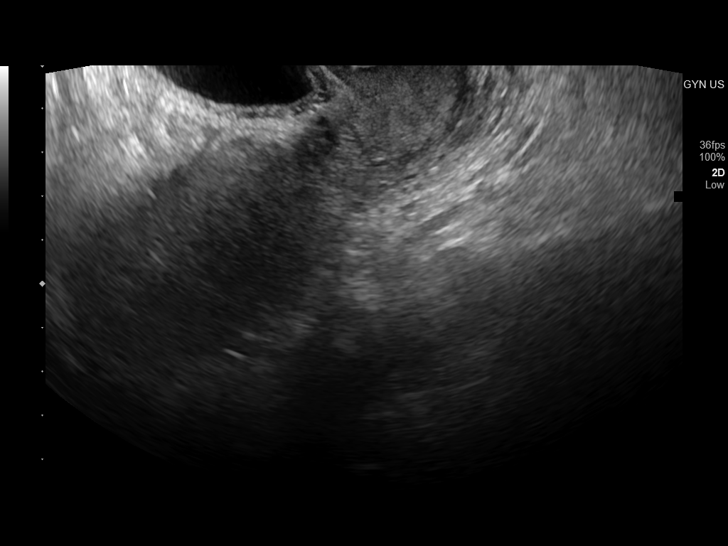
[im 102/102]
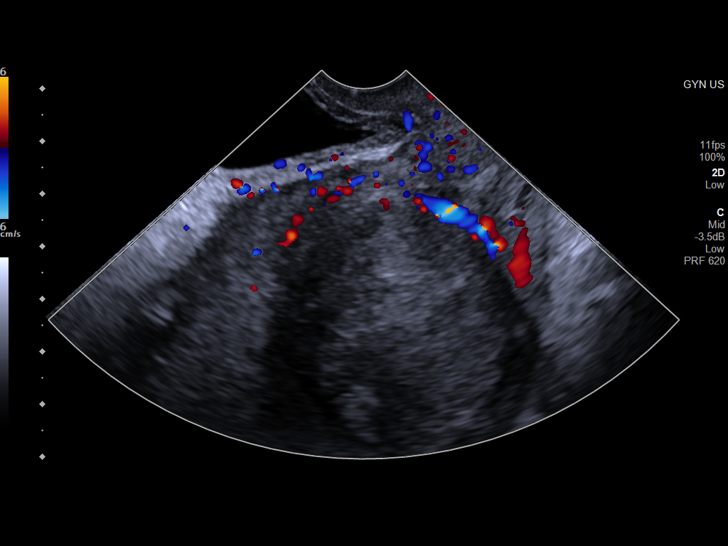

[13 of 25 positions shown; findings below may reference images not displayed]

FINDINGS: Uterus

Measurements: 12.4 x 6.5 x 6.7 cm = volume: 280 mL mL. Large mass of
heterogeneous echogenicity centered in the body of the uterus,
potentially arising from the endometrial canal, measuring
approximately 5.7 x 4.3 cm. There is a paucity of internal flow
within this lesion on color Doppler imaging.

Endometrium

Obscured by the mass discussed above.

Right ovary

Could not be visualized.

Left ovary

Measurements: 5.4 x 2.5 x 4.4 cm = volume: 30.9 mL. Small follicles.
Normal appearance/no adnexal mass.

Pulsed Doppler evaluation of both ovaries demonstrates normal
low-resistance arterial and venous waveforms.

Other findings

Small volume of free fluid in the cul-de-sac. There is a small
collection of hypoechoic material anterior to the uterus beneath the
C-section scar measuring approximately 9.1 x 1.0 x 4.1 cm, likely a
postoperative seroma.
IMPRESSION: 1. Large mass-like area in the body of the uterus potentially
arising from the endometrial canal. Given the patient's history and
symptoms, retained products of conception can not be completely
excluded. This appears essentially avascular on today's examination,
however, some cases of retained products of conception can appear
relatively avascular. Alternatively, this could represent a large
fibroid. Further clinical evaluation is recommended.
2. Limited study which was unable to visualize the right ovary.
3. Normal appearance of the left ovary.
4. Small volume of free fluid in the cul-de-sac.
5. Small collection of fluid beneath the C-section scar, likely a
small postoperative seroma. This does not appear to exert mass
effect upon adjacent structures, suggesting against an abscess.

## 2023-10-05 NOTE — Patient Instructions (Addendum)
 Belly Hernia (Ventral Hernia): What to Know  A ventral hernia is a bulge of tissue from inside the belly that pushes through a weak area of the belly. Sometimes, the bulge may have tissue from the small intestine or the large intestine. Ventral hernias do not go away without surgery. There are several types of ventral hernias. You may have: A hernia at a place where surgery was done (incisional hernia). A hernia just above the belly button (epigastric or paraumbilical hernia) or at the belly button (umbilical hernia). These can happen because of heavy lifting or straining. A hernia that comes and goes (reducible hernia). It may be visible only when you lift or strain. This type of hernia can be pushed back into the belly. A hernia that traps belly tissue inside the hernia (incarcerated hernia). This hernia cannot be pushed back into the belly. A hernia that cuts off blood flow to the tissues inside the hernia (strangulation hernia). The tissues can start to die if this happens. This is a very painful bulge that cannot be pushed back into the belly. This type of hernia is a medical emergency. What are the causes? This condition happens when tissue in the belly pushes on a weak area in the muscles. What increases the risk? You're more likely to have this condition if: You are 60 years or older. You had belly surgery in the past. This is common if there was an infection after surgery. You had an injury to the belly. You often lift or push heavy objects. You have been pregnant several times. You have long-term (chronic) health conditions that put pressure in your belly. These include: Being overweight or obese. Having a buildup of fluid inside your belly (ascites). You throw up or cough over and over again. You have trouble pooping (constipation). You strain to poop or pee. What are the signs or symptoms? The only symptom of a ventral hernia may be a painless bulge in the belly.  Reducible  hernia may be visible only when you strain, cough, or lift. Other symptoms may include: Dull pain. A feeling of pressure. Symptoms of an incarcerated hernia may include: Tenderness at hernia site. Bloating. Throwing up or feeling like you may throw up. Trouble pooping or no pooping at all. Symptoms of a strangulated hernia may include: More pain. Throwing up or feeling like you may throw up. Pain when pressing on the hernia. The skin over the hernia turning red or purple. Trouble pooping. Blood in the poop. How is this diagnosed? This condition may be diagnosed based on: Your symptoms and medical history. A physical exam. You may be asked to cough or strain while standing. These actions increase the pressure inside your belly and force the hernia through the opening in your belly. Your health care provider may try to reduce the hernia by gently pushing the hernia back in. Imaging studies, such as an ultrasound or CT scan. How is this treated? This condition is treated with surgery. If you have a strangulated hernia, surgery is done as soon as possible. If your hernia is small and not incarcerated, you may be asked to lose some weight before surgery. Follow these instructions at home: Eat and drink only as you've been told. Lose weight, if told by your provider. You may have to avoid lifting. Ask your provider how much you can safely lift. Avoid activities that increase pressure on your hernia. Take your medicines only as told. You may need to take steps to help treat  or prevent trouble pooping (constipation), such as: Taking medicines to help you poop. Eating foods high in fiber, like beans, whole grains, and fresh fruits and vegetables. Drinking more fluids as told. Ask your provider if it's safe to drive or use machines while taking your medicine. Contact a health care provider if: Your hernia gets larger or feels hard. Your hernia becomes painful. Get help right away if: Your  hernia becomes very painful. You have pain along with any of these: Changes in skin color in the area of the hernia. Feeling like throwing up. Throwing up. Fever. These symptoms may be an emergency. Call 911 right away. Do not wait to see if the symptoms will go away. Do not drive yourself to the hospital. This information is not intended to replace advice given to you by your health care provider. Make sure you discuss any questions you have with your health care provider.  Bariatric Surgery Information Bariatric surgery, also called weight-loss surgery, is a procedure that helps you lose weight. You may have bariatric surgery if: You have been unable to lose weight through diet and exercise. You have health problems caused by obesity, such as: Type 2 diabetes. Heart disease. Lung disease. How does bariatric surgery help me lose weight? Bariatric surgery helps you lose weight by: Decreasing how much food your body absorbs. This is done by closing off part of your stomach to make it smaller. This limits the amount of food your stomach can hold. Changing your body's regular digestive process so that food bypasses the parts of your body that absorb calories and nutrients. If you decide to have bariatric surgery, it is important to continue to eat a healthy diet and to exercise regularly after the surgery. What are the risks of bariatric surgery? As with any surgical procedure, each type of bariatric surgery has its own risks. These risks also depend on your age, your overall health, and any other medical conditions you may have. Risks of bariatric surgery can be divided into two groups. There are short-term risks and long-term risks. Short-term risks include: Infection. Bleeding. Allergic reactions to medicines or dyes. A blood clot that forms in the leg and travels to the heart or lungs. Leaking of digestive juices into the abdomen. Long-term risks and complications include: Not  getting enough nutrients for your body (malnutrition). Narrowing of the digestive tract (stricture or stenosis). Diarrhea, nausea, or vomiting after eating (dumping syndrome). Failure of the device or procedure. This may require another surgery to correct the problem. When deciding on bariatric surgery, it is very important that you: Talk to your health care provider and choose the surgery that is best for you. Ask your health care provider about specific risks for the surgery you choose. What are the different kinds of bariatric surgery? There are two kinds of bariatric surgeries: Restrictive surgery. This procedure makes your stomach smaller. It does not change your digestive process. The smaller the size of your new stomach, the less food you can eat. There are different types of restrictive surgeries. Malabsorptive surgery. This procedure makes your stomach smaller and alters your digestive process so that your body processes fewer calories and nutrients. These are the most common kind of bariatric surgery. There are different types of malabsorptive surgeries. What are the different types of restrictive surgery? Adjustable gastric banding In this procedure, an inflatable band is placed around your stomach near the upper end. This makes the passageway for food into the rest of your stomach much smaller.  The band can be adjusted, making it tighter or looser, by filling it with salt solution. Your surgeon can adjust the band based on how you are feeling and how much weight you are losing. The band can be removed in the future. This requires another surgery. Sleeve gastrectomy In this procedure, your stomach is made smaller. This is done by surgically removing a large part of your stomach. When your stomach is smaller, you feel full more quickly and reduce how much you eat. What are the different types of malabsorptive surgery? Roux-en-Y gastric bypass (RGB) This is the most common weight-loss  surgery. In this procedure, a small stomach pouch (gastric pouch) is created in the upper part of your stomach. Next, this gastric pouch is attached directly to the middle part of your small intestine. The farther down your small intestine the new connection is made, the fewer calories and nutrients you will absorb. This surgery has the highest rate of complications. Biliopancreatic diversion with duodenal switch (BPD/DS) This is a multi-step procedure. First, a large part of your stomach is removed, making your stomach smaller. Next, this smaller stomach is attached to the lower part of your small intestine. Like the RGB surgery, you absorb fewer calories and nutrients if your stomach is attached farther down the small intestine. Where to find more information American Society for Metabolic and Bariatric Surgery: www.asmbs.[employer] Diabetes and Digestive and Kidney Diseases: [employer].tn Summary Bariatric surgery, also called weight-loss surgery, is a procedure that helps you lose weight. This surgery may be recommended if you have been unable to lose weight through diet and exercise, or you have health problems caused by obesity, such as type 2 diabetes, heart disease, or lung disease. Generally, risks of bariatric surgery include infection, bleeding, and failure of the surgery or device. Failure of the surgery or device may require another surgery to correct the problem. This information is not intended to replace advice given to you by your health care provider. Make sure you discuss any questions you have with your health care provider. Document Revised: 10/30/2020 Document Reviewed: 10/30/2020 Elsevier Patient Education  2024 [employer].

## 2023-10-05 NOTE — Progress Notes (Addendum)
 Patient ID: Nichole Berry, female   DOB: 1986-05-25, 38 y.o.   MRN: [id number]  Chief Complaint: Hernia pain  History of Present Illness Nichole Berry is a 38 y.o. female with a history of 4 pelvic procedures via Pfannenstiel incision.  She is a smoker with a BMI of 56 currently.  She had injections initiated by her PCP for weight loss, they are intolerable.  She reports some intermittent nausea, denies any vomiting, denies any abdominal crampy pain.  She reports Berry known fevers or chills.  She reports Berry significant loss of flatulence.  She presented to the ED with complaints of pain between 8 and 8.5.  She was hoping to get some assistance with pain medications today.  She had a CT scan on her presentation to the ER on September 18, 2023. Reports her activities of daily living are difficult because she has to watch how she moves.  She reports some challenges and stretching for things and picking up her 34-year-old.  Her husband is present with her today. She has never had a referral to a bariatric surgeon.   Past Medical History Past Medical History:  Diagnosis Date   Anemia    Hidradenitis    Strep throat    FINISHED ANTIBIOTICS ON 09-12-18      Past Surgical History:  Procedure Laterality Date   CESAREAN SECTION  2006, 2013   x2   CESAREAN SECTION N/A 07/05/2018   Procedure: REPEAT CESAREAN SECTION;  Surgeon: Nadara Mustard, MD;  Location: [facility] ORS;  Service: Obstetrics;  Laterality: N/A;  Female born @ [birth year] Apgars: 9/9 Weight: 6lbs 2 ozs   DILATATION & CURETTAGE/HYSTEROSCOPY WITH MYOSURE N/A 09/09/2021   Procedure: DILATATION & CURETTAGE/HYSTEROSCOPY WITH MYOSURE AND PLACEMENT OF UTERINE TAMPONADE BALLOON;  Surgeon: Christeen Douglas, MD;  Location: [facility] ORS;  Service: Gynecology;  Laterality: N/A;   HYDRADENITIS EXCISION Right 09/14/2018   Procedure: EXCISION HIDRADENITIS AXILLA - RIGHT;  Surgeon: Duanne Guess, MD;  Location: [facility] ORS;  Service: General;  Laterality: Right;     Allergies  Allergen Reactions   Latex Hives    Current Outpatient Medications  Medication Sig Dispense Refill   FLUoxetine (PROZAC) 20 MG capsule Take 1 capsule (20 mg total) by mouth daily. 10 capsule 0   lurasidone (LATUDA) 40 MG TABS tablet Take 1 tablet (40 mg total) by mouth at bedtime. 10 tablet 0   nicotine (NICODERM CQ - DOSED IN MG/24 HOURS) 21 mg/24hr patch Place 1 patch (21 mg total) onto the skin daily. 14 patch 0   nicotine polacrilex (NICORETTE) 2 MG gum Take 1 each (2 mg total) by mouth as needed for smoking cessation. 50 tablet 0   ondansetron (ZOFRAN-ODT) 4 MG disintegrating tablet Take 1 tablet (4 mg total) by mouth every 8 (eight) hours as needed. 20 tablet 0   sulfamethoxazole-trimethoprim (BACTRIM DS) 800-160 MG tablet Take 1 tablet by mouth every 12 (twelve) hours. 6 tablet 0   thiamine (VITAMIN B-1) 100 MG tablet Take 1 tablet (100 mg total) by mouth daily. 10 tablet 0   topiramate (TOPAMAX) 25 MG tablet Take 1 tablet (25 mg total) by mouth 2 (two) times daily at 8 am and 4 pm. 20 tablet 0   traZODone (DESYREL) 100 MG tablet Take 1 tablet (100 mg total) by mouth at bedtime. 10 tablet 0   Berry current facility-administered medications for this visit.    Family History Family History  Problem Relation Age of Onset   Hypertension Mother  Diabetes Mother    Kidney disease Father    Cancer Maternal Grandmother    Diabetes Maternal Grandmother    Hypertension Maternal Grandmother    Pancreatic cancer Maternal Grandmother    Cancer Maternal Grandfather    Diabetes Maternal Grandfather    Hypertension Maternal Grandfather    Pancreatic cancer Maternal Grandfather    Colon cancer Neg Hx    Breast cancer Neg Hx       Social History Social History   Tobacco Use   Smoking status: Former    Current packs/day: 0.00    Average packs/day: 0.5 packs/day for 14.0 years (7.0 ttl pk-yrs)    Types: Cigarettes    Start date: 09/18/2003    Quit date: 09/17/2017     Years since quitting: 6.0   Smokeless tobacco: Never  Vaping Use   Vaping status: Never Used  Substance Use Topics   Alcohol use: Berry   Drug use: Berry        Review of Systems  Constitutional: Negative.   HENT: Negative.    Eyes: Negative.   Respiratory: Negative.    Cardiovascular: Negative.   Gastrointestinal:  Positive for abdominal pain, constipation, heartburn and nausea.  Genitourinary: Negative.   Skin: Negative.   Neurological: Negative.   Psychiatric/Behavioral:  Positive for depression.      Physical Exam Blood pressure 131/87, pulse 93, temperature 98.3 F (36.8 C), temperature source Oral, height 5\' 6"  (1.676 m), weight (!) 350 lb 12.8 oz (159.1 kg), last menstrual period 08/23/2023, SpO2 99%. Last Weight  Most recent update: 10/05/2023  9:06 AM    Weight  159.1 kg (350 lb 12.8 oz)               CONSTITUTIONAL: Well developed, morbidly obese body habitus, appropriately responsive and aware without distress.   EYES: Sclera non-icteric.   EARS, NOSE, MOUTH AND THROAT:  The oropharynx is clear. Oral mucosa is pink and moist.    Hearing is intact to voice.  NECK: Trachea is midline, and there is Berry jugular venous distension.  LYMPH NODES:  Lymph nodes in the neck are not appreciated. RESPIRATORY:  Lungs are clear, and breath sounds are equal bilaterally.  Normal respiratory effort without pathologic use of accessory muscles. CARDIOVASCULAR: Heart is regular in rate and rhythm.   Well perfused.  GI: The abdomen is well-rounded, more pear-shaped in girth, mildly tender over hernia defect which is to the right of midline and infraumbilical area, otherwise soft, nontender, and nondistended. There were Berry palpable masses.  MUSCULOSKELETAL:  Symmetrical muscle tone appreciated in all four extremities.    SKIN: Skin turgor is normal. Berry pathologic skin lesions appreciated.  NEUROLOGIC:  Motor and sensation appear grossly normal.  Cranial nerves are grossly without  defect. PSYCH:  Alert and oriented to person, place and time. Affect is evident in her tearfulness, considering the lifestyle changes including quitting smoking and the weight loss necessary to pursue elective hernia surgery, I believe this is appropriate for situation.  Data Reviewed I have personally reviewed what is currently available of the patient's imaging, recent labs and medical records.   Labs:     Latest Ref Rng & Units 09/18/2023    3:27 AM 01/01/2023   12:05 AM 09/09/2021   12:47 PM  CBC  WBC 4.0 - 10.5 K/uL 9.8  7.6  8.0   Hemoglobin 12.0 - 15.0 g/dL 16.1  09.6  9.1   Hematocrit 36.0 - 46.0 % 34.9  35.4  28.7  Platelets 150 - 400 K/uL 331  344  319       Latest Ref Rng & Units 09/18/2023    3:27 AM 01/02/2023    7:16 AM 01/01/2023   12:05 AM  CMP  Glucose 70 - 99 mg/dL 161  92  99   BUN 6 - 20 mg/dL 14  11  7    Creatinine 0.44 - 1.00 mg/dL 0.96  0.45  4.09   Sodium 135 - 145 mmol/L 137  139  137   Potassium 3.5 - 5.1 mmol/L 3.8  3.7  3.1   Chloride 98 - 111 mmol/L 103  106  103   CO2 22 - 32 mmol/L 23  25  23    Calcium 8.9 - 10.3 mg/dL 9.1  8.6  8.9   Total Protein 6.5 - 8.1 g/dL   7.6   Total Bilirubin 0.3 - 1.2 mg/dL   0.5   Alkaline Phos 38 - 126 U/L   60   AST 15 - 41 U/L   17   ALT 0 - 44 U/L   14    Imaging: Radiological images reviewed:  CLINICAL DATA:  chronic lower abd pain after C section 2 years ago. eval uterine fibroids, hernia, appy, sbo   EXAM: CT ABDOMEN AND PELVIS WITHOUT CONTRAST   TECHNIQUE: Multidetector CT imaging of the abdomen and pelvis was performed following the standard protocol without IV contrast.   RADIATION DOSE REDUCTION: This exam was performed according to the departmental dose-optimization program which includes automated exposure control, adjustment of the mA and/or kV according to patient size and/or use of iterative reconstruction technique.   COMPARISON:  None Available.   FINDINGS: Lower chest: Berry pleural or  pericardial effusion. Visualized lung bases clear.   Hepatobiliary: Berry focal liver abnormality is seen. Berry gallstones, gallbladder wall thickening, or biliary dilatation.   Pancreas: Unremarkable. Berry pancreatic ductal dilatation or surrounding inflammatory changes.   Spleen: Normal in size without focal abnormality.   Adrenals/Urinary Tract: Adrenal glands are unremarkable. Kidneys are normal, without renal calculi, focal lesion, or hydronephrosis. Bladder is unremarkable.   Stomach/Bowel: Stomach incompletely distended. Small bowel decompressed. Loops of small bowel protrude into a moderately large bilobed midline anterior pelvic wall hernia, without evidence of obstruction or strangulation. Normal appendix. The colon is partially distended, without acute finding.   Vascular/Lymphatic: Berry significant vascular findings are present. Berry enlarged abdominal or pelvic lymph nodes.   Reproductive: Uterus and bilateral adnexa are unremarkable.   Other: Berry ascites.  Bilateral pelvic phleboliths.  Berry free air.   Musculoskeletal: Small paraumbilical hernia containing only mesenteric fat. Moderately large anterior pelvic ventral hernia involving loops of small bowel without obstruction or strangulation. Mild facet DJD L4-S1. Bilateral nonerosive sacroiliitis.   IMPRESSION: 1. Berry acute findings. 2. Moderately large pelvic ventral hernia containing loops of small bowel without obstruction or strangulation. 3. Small paraumbilical hernia containing only mesenteric fat.     Electronically Signed   By: Corlis Leak M.D.   On: 09/18/2023 08:38   Within last 24 hrs: Berry results found.  Assessment     Patient Active Problem List   Diagnosis Date Noted   Class 3 severe obesity due to excess calories without serious comorbidity with body mass index (BMI) of 50.0 to 59.9 in adult [facility]) 10/05/2023   Severe obesity (BMI >= 40) (HCC) 10/05/2023   Incisional hernia, without obstruction or  gangrene 10/05/2023   Severe recurrent major depression without psychotic features (HCC) 01/01/2023  Retained products of conception after delivery without hemorrhage 09/09/2021   Previous cesarean section complicating pregnancy 07/31/2021   [redacted] weeks gestation of pregnancy 07/31/2021   Hidradenitis suppurativa of right axilla 09/14/2018   Hidradenitis suppurativa    Delivery of pregnancy by cesarean section 07/07/2018   Postpartum care following cesarean delivery 07/07/2018   History of cesarean section 06/23/2018    Plan    We discussed various options to pursue in order to achieve the weight loss desired prior to considering elective surgery.  We discussed referral to bariatric surgery program outside of [place] [facility].  We discussed medical management for weight loss.  We discussed a healthy course of exercise and burning additional calories through attempts at walking and opposed to utilizing easier ways i.e. driving. Her spouse is present, Ned Grace, who is encouraging her to continue the weight loss since appears to understand how critical this is for lifestyle change.  Even more importantly than weight loss is also smoking cessation. I have discouraged her from resorting to pain management with narcotic pain pills.  Will have her follow-up in 3 months, to continue to encourage and promote progress in her weight loss plan along with tobacco cessation.  Face-to-face time spent with the patient and accompanying care providers(if present) was 45 minutes, spent counseling, educating, and coordinating care of the patient.    These notes generated with voice recognition software. I apologize for typographical errors.  Campbell Lerner M.D., FACS 10/05/2023, 10:13 AM

## 2023-11-23 ENCOUNTER — Other Ambulatory Visit: Payer: Self-pay

## 2024-01-19 ENCOUNTER — Ambulatory Visit: Payer: MEDICAID | Admitting: Dietician

## 2024-05-18 ENCOUNTER — Ambulatory Visit: Payer: MEDICAID | Admitting: Surgery
